# Patient Record
Sex: Female | Born: 1950 | Race: White | Hispanic: No
Health system: Midwestern US, Academic
[De-identification: ages and names within clinical notes are randomized; demographics above are authoritative.]

---

## 2012-07-17 ENCOUNTER — Ambulatory Visit: Admit: 2012-07-17 | Discharge: 2012-07-17 | Payer: MEDICARE | Attending: Sports Medicine

## 2012-07-17 ENCOUNTER — Inpatient Hospital Stay: Admit: 2012-07-17 | Payer: MEDICARE | Attending: Sports Medicine

## 2012-07-17 DIAGNOSIS — R52 Pain, unspecified: Secondary | ICD-10-CM

## 2012-07-17 DIAGNOSIS — M7989 Other specified soft tissue disorders: Secondary | ICD-10-CM

## 2012-07-17 NOTE — Unmapped (Signed)
This office note has been dictated.

## 2012-07-18 NOTE — Unmapped (Signed)
[facility]  AND SPORTS MEDICINE     PATIENT NAME:  Cindy Ramirez, Cindy Ramirez              MRN:  [id number]  DATE OF BIRTH:  November 23, 1950                   CSN:  [id number]  DICTATED BY:   Lysbeth Galas, M.D.        VISIT DATE:  07/17/2012                                       OFFICE NOTE     Cindy Ramirez is a 62 year old female who presents with left ankle pain. She was in  the airport in [place].  When she was walking off the plane stepped in  a ____ and actually rolled her ankle.  Had x-rays taken there that were  inconclusive.  Presents today for further evaluation.     The patient's past medical history is documented in the chart as part of the  electronic medical record and reviewed with the patient.     On physical exam she has diffuse swelling over the anterolateral aspect of  her ankle with tenderness and ecchymosis.  She has tenderness over the entire  talofibular, calcaneofibular ligaments as well as the distal fibula.  Moderate medial tenderness.  Dorsiflexion to neutral, plantarflexion to 20  degrees.  Has pain with inversion stress testing.  Neurologically is intact  and has good distal pulses and good capillary refill.     X-rays show no obvious fracture.  There is a small dystrophic calcification  area that actually may be in the soft tissue or actually in a vessel but  definitely not coming from the bone, it is well corticated.  Has some mild  periosteal elevation.     IMPRESSION: Left ankle sprain.     At this time we are going to place her in a high-tide boot.  She will return  in 1 week at which time we will repeat her x-ray.                                             Lysbeth Galas, M.D.  SWD/jwa  D:  07/18/2012 11:12  T:  07/23/2012 08:18  Job #:  [id number]                                                      OFFICE NOTE                                                               PAGE    1 of   1

## 2012-07-25 ENCOUNTER — Inpatient Hospital Stay: Admit: 2012-07-25 | Payer: MEDICARE | Attending: Sports Medicine

## 2012-07-25 ENCOUNTER — Ambulatory Visit: Admit: 2012-07-25 | Discharge: 2012-07-25 | Payer: MEDICARE | Attending: Sports Medicine

## 2012-07-25 DIAGNOSIS — M25569 Pain in unspecified knee: Secondary | ICD-10-CM

## 2012-07-25 DIAGNOSIS — M25579 Pain in unspecified ankle and joints of unspecified foot: Secondary | ICD-10-CM

## 2012-07-25 NOTE — Unmapped (Signed)
This office note has been dictated.

## 2012-07-25 NOTE — Unmapped (Signed)
[facility] AND SPORTS MEDICINE     PATIENT NAME:  Cindy Ramirez, Cindy Ramirez              MRN:  [id number]  DATE OF BIRTH:  Jun 22, 1950                   CSN:  [id number]  DICTATED BY:  Lysbeth Galas, M.D.         VISIT DATE:  07/25/2012                                       OFFICE NOTE     [name] returns for evaluation.  Her left ankle is actually doing better.  She had had the fall when she was walking through the airport in [place] and has been still with some pain.  There was a questionable  fracture and we put her in a boot.  Did repeat her x-rays.  X-rays show no  obvious fracture noted.  I think this is more of a sprain.     On physical exam on her left ankle she is more tender over the anterior  tibofibular and calcaneal fibular ligaments as well as in the deltoid  ligament area.  This is more of an ankle sprain.  We are going to treat this.     At the time of the fall and the fact that she is having some increasing  irritation and pain because she has been walking ____ she is developing more  knee pain than right ankle pain.  Her right ankle she had a significant  fracture in that ankle some time ago and has had significant pain with  increasing irritation and weakness through the peroneal.  The knee has pain  since her fall.     On physical exam on her ankle, the ankle has significant crepitus with  decreased range of motion.  She has a ganglion on the anterior capsular area.  She has weakness testing with the peroneal musculature with 4/5 strength with  increasing pain.  Her right knee has a large suprapatellar bursitis, tender  over the medial joint line, tender over the anterior tibial tuberosity.  Her  Lachman's is negative, however, does have a positive drop back on testing her  PCL.  She has pain with McMurray's testing.     At this time, her left ankle, we are going to treat her left ankle more for a  sprain.  We are going to  obtain an MRI on her right knee to rule out medial  meniscus tear.  MRI on her right ankle to rule out peroneal tendon tear.  She  will return after MRIs for further evaluation.                                                Lysbeth Galas, M.D.  SWD/smj  D:  07/25/2012 16:30  T:  07/30/2012 07:48  Job #:  [id number]           OFFICE NOTE  PAGE    1 of   1

## 2012-07-31 ENCOUNTER — Ambulatory Visit: Admit: 2012-07-31 | Discharge: 2012-07-31 | Payer: MEDICARE | Attending: Sports Medicine

## 2012-07-31 DIAGNOSIS — M2240 Chondromalacia patellae, unspecified knee: Secondary | ICD-10-CM

## 2012-07-31 NOTE — Unmapped (Signed)
This office note has been dictated.

## 2012-07-31 NOTE — Unmapped (Signed)
sched appt with dr Sandre Kitty on 08/25/12 at 12pm at [facility]

## 2012-08-01 NOTE — Unmapped (Signed)
[facility]  AND SPORTS MEDICINE     PATIENT NAME:  Cindy, Ramirez              MRN:  [id number]  DATE OF BIRTH:  Apr 15, 1950                   CSN:  [id number]  DICTATED BY:   Lysbeth Galas, M.D.        VISIT DATE:  07/31/2012                                       OFFICE NOTE     [name] returns for evaluation.  Her left ankle which was the ankle sprain  that she injured acutely, is doing better.  She still has a little tenderness  over the anterior talofibular and calcaneofibular ligaments.  However, she  did aggravate her right ankle and her right knee in the fall.  We had  obtained an MRI on her right knee.  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  A patellar tilt, elongated appearance of the lateral patellar  facet and a shallow trochlear groove.  No meniscal ligamentous tear, some  chronic osseous body posterior recess in the medial compartment, multiple  synovial cysts associated within the posterior aspect of the knee.  There was  also an area on the anterior aspect of the knee that thought may have been a  prepatellar bursitis; although, it does not look like there is any swelling  or cystic lesion through this area.  This is more soft tissue and fibrous  tissue.     Number two is her right ankle MRI.  The right ankle MRI was actually  impressive.  She has a chronic anterior talofibular ligament tear with  significant subluxation of the talus related to the tibia and on the anterior  fascia, she has talar calcaneal impingement.  Subluxation of the navicular  related to the talar head, stress response in the anterior process of the  calcaneus, low grade subarticular stress reaction in the cuneiform.  There is  no tendon or significant tearing.  Advanced chondromalacia and degenerative  arthritis and subarticular stress response in the talar  navicular joint.  Her  ankle, she has a lot of chronic changes which was aggravated by the acute  fall; however, we are going to go ahead and have her follow up with Dr. Zenaida Niece  or Dr. Sandre Kitty to evaluate her ankle.  These are chronic changes that need to  be addressed and she is progressively getting worse.     Number ___, her knee, she has patellofemoral dysfunction chondromalacia  patella.  We are going to get her started in a physical therapy program and  hopefully that will get that calmed down.  She will return in four weeks for  further evaluation.                                             Lysbeth Galas, M.D.  SWD/tlm  D:  08/01/2012 08:01  T:  08/05/2012 15:01  Job #:  [id number]                                                      OFFICE NOTE                                                               PAGE    1 of   1

## 2012-08-05 NOTE — Unmapped (Signed)
Faxed rx for aircast to [phone]

## 2012-08-05 NOTE — Unmapped (Signed)
Message copied by Renae Fickle on Tue Aug 05, 2012  4:33 PM  ------       Message from: Ester Rink       Created: Tue Aug 05, 2012 11:54 AM         Loel from [facility] called requesting office notes for [facility] and and mri report-fax to [phone].  He also wanted to know if he could place her left ankle in a brace or stirrup.  If so, fax a prescription to same #.  If you have any questions you can call him at [phone].  ------

## 2016-02-01 NOTE — Telephone Encounter (Signed)
PT is scheduled for a new pt appt on 12/7, pt states she has shingles and was wondering if she can be seen any earlier than 12/7

## 2016-02-01 NOTE — Telephone Encounter (Signed)
LVM informing patient I moved her apt for 11/17 at 11:45 arrival time 11:30

## 2016-02-01 NOTE — Telephone Encounter (Signed)
If I have a same day friday can add her on there

## 2016-02-03 ENCOUNTER — Ambulatory Visit: Admit: 2016-02-03 | Payer: MEDICARE

## 2016-02-03 DIAGNOSIS — Z4822 Encounter for aftercare following kidney transplant: Secondary | ICD-10-CM

## 2016-02-03 DIAGNOSIS — Z940 Kidney transplant status: Secondary | ICD-10-CM

## 2016-02-03 LAB — CBC
Hematocrit: 40.5 % (ref 35.0–45.0)
Hemoglobin: 13.7 g/dL (ref 11.7–15.5)
MCH: 31.1 pg (ref 27.0–33.0)
MCHC: 33.9 g/dL (ref 32.0–36.0)
MCV: 91.6 fL (ref 80.0–100.0)
MPV: 8 fL (ref 7.5–11.5)
Platelets: 286 10*3/uL (ref 140–400)
RBC: 4.42 10*6/uL (ref 3.80–5.10)
RDW: 13.6 % (ref 11.0–15.0)
WBC: 5.7 10*3/uL (ref 3.8–10.8)

## 2016-02-03 LAB — URINALYSIS W/RFL MICROSCOP, RFL CULTURE
Bilirubin, UA: NEGATIVE
Blood, UA: NEGATIVE
Glucose, UA: NEGATIVE mg/dL
Ketones, UA: NEGATIVE mg/dL
Leukocytes, UA: NEGATIVE
Nitrite, UA: NEGATIVE
Protein, UA: NEGATIVE mg/dL
RBC, UA: <1 /HPF (ref 0–3)
Specific Gravity, UA: 1.008 (ref 1.005–1.035)
Urobilinogen, UA: <2 mg/dL (ref 0.2–1.9)
WBC, UA: <1 /HPF (ref 0–5)
pH, UA: 6 (ref 5.0–8.0)

## 2016-02-03 LAB — DIFFERENTIAL
Basophils Absolute: 29 /uL (ref 0–200)
Basophils Relative: 0.5 % (ref 0.0–1.0)
Eosinophils Absolute: 74 /uL (ref 15–500)
Eosinophils Relative: 1.3 % (ref 0.0–8.0)
Lymphocytes Absolute: 2018 /uL (ref 850–3900)
Lymphocytes Relative: 35.4 % (ref 15.0–45.0)
Monocytes Absolute: 359 /uL (ref 200–950)
Monocytes Relative: 6.3 % (ref 0.0–12.0)
Neutrophils Absolute: 3221 /uL (ref 1500–7800)
Neutrophils Relative: 56.5 % (ref 40.0–80.0)
nRBC: 0 /100 WBC (ref 0–0)

## 2016-02-03 LAB — LIPID PANEL
Cholesterol, Total: 235 mg/dL (ref 0–200)
HDL: 58 mg/dL (ref 60–92)
LDL Cholesterol: 147 mg/dL
Triglycerides: 150 mg/dL (ref 10–149)

## 2016-02-03 LAB — VITAMIN D 25 HYDROXY: Vit D, 25-Hydroxy: 34.2 ng/mL (ref 30.0–100)

## 2016-02-03 LAB — RENAL FUNCTION PANEL, FASTING
Albumin: 3.9 g/dL (ref 3.5–5.7)
Anion Gap: 5 mmol/L (ref 3–16)
BUN: 26 mg/dL (ref 7–25)
CO2: 29 mmol/L (ref 21–33)
Calcium: 9.5 mg/dL (ref 8.6–10.3)
Chloride: 104 mmol/L (ref 98–110)
Creatinine: 0.98 mg/dL (ref 0.60–1.30)
Glucose: 104 mg/dL (ref 70–100)
Osmolality, Calculated: 291 mOsm/kg (ref 278–305)
Phosphorus: 3.9 mg/dL (ref 2.1–4.7)
Potassium: 4.4 mmol/L (ref 3.5–5.3)
Sodium: 138 mmol/L (ref 133–146)
eGFR AA CKD-EPI: 71 See note.
eGFR NONAA CKD-EPI: 61 See note.

## 2016-02-03 LAB — HEPATIC FUNCTION PANEL, SERUM
ALT: 12 U/L (ref 7–52)
AST (SGOT): 20 U/L (ref 13–39)
Albumin: 3.8 g/dL (ref 3.5–5.7)
Alkaline Phosphatase: 79 U/L (ref 36–125)
Bilirubin, Direct: 0.17 mg/dL (ref 0.00–0.40)
Bilirubin, Indirect: 0.53 mg/dL (ref 0.00–1.10)
Total Bilirubin: 0.7 mg/dL (ref 0.0–1.5)
Total Protein: 6.7 g/dL (ref 6.4–8.9)

## 2016-02-03 LAB — HEMOGLOBIN A1C: Hemoglobin A1C: 4.7 % (ref 4.8–6.4)

## 2016-02-03 LAB — THYROID FUNCTION CASCADE: TSH: 1.37 u[IU]/mL (ref 0.34–5.60)

## 2016-02-03 MED ORDER — acetaminophen-codeine (TYLENOL #3) 300-30 mg per tablet
300-30 | ORAL_TABLET | Freq: Four times a day (QID) | ORAL | 0 refills | 8.00000 days | Status: AC | PRN
Start: 2016-02-03 — End: 2016-10-16

## 2016-02-03 NOTE — Unmapped (Signed)
[facility]  [facility] HEALTH PRIMARY CARE MED PEDS  [address]  [place] [address]    Name:  Cindy Ramirez Date of Birth: February 28, 1951 (65 y.o.)   MRN: [id number]    Date of Service:  02/03/2016     Subjective:     Chief Complaint   Patient presents with   ??? New Patient Visit/ Consultation     Establishment of care   ??? Herpes Zoster     x 9 days     History of Present Illness:  Cindy Ramirez is a(n) 65 y.o. 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 citalopram (CELEXA) 40 MG tablet Take 40 mg by mouth daily.     ??? cycloSPORINE modified, NEORAL, 50 MG capsule Take 75 mg by mouth 2 times a day.     ??? levothyroxine (SYNTHROID, LEVOTHROID) 25 MCG tablet Take 25 mcg by mouth daily.     ??? multivitamin (THERAGRAN) tablet Take 1 tablet by mouth daily.     ??? nitrofurantoin (MACRODANTIN) 100 MG capsule Take 100 mg by mouth 4 times a day with meals and at bedtime.     ??? vitamin B complex w-C (B-COMPLEX WITH VITAMIN C) 15-100-4-500 mg Tab Take 1 tablet by mouth daily.     ??? acyclovir (ZOVIRAX) 200 MG capsule Take by mouth every 4 (four) hours while awake.     ??? cholecalciferol, vitamin D3, 1000 units tablet Take 1,000 Units by mouth daily.     ??? gabapentin (NEURONTIN) 100 MG capsule Take 100 mg by mouth 3 times a day.     ??? mycophenolate (CELLCEPT) 500 mg tablet Take 1,000 mg by mouth 2 times a day.     ??? NIFEdipine (ADALAT CC) 30 MG 24 hr tablet Take 30 mg by mouth daily.     ??? pravastatin (PRAVACHOL) 20 MG tablet Take 20 mg by mouth daily.     ??? vitamin E 1000 UNIT capsule Take 1,000 Units by mouth daily.       No current facility-administered medications for this visit.        Review of Systems   Constitutional: Positive for fatigue. Negative for chills and fever.   HENT: Negative for congestion and rhinorrhea.    Respiratory: Negative for cough and shortness of breath.    Cardiovascular: Negative for chest pain.   Gastrointestinal: Positive for abdominal distention (just an ache over her transplant).   Genitourinary: Negative for dysuria.   Skin: Positive for rash (shingles).   All other systems reviewed and are negative.           Objective:     Vitals:    02/03/16 1153   BP: 130/82   BP Location: Right arm   Patient Position: Sitting   BP Cuff Size: Large   Pulse: 68   SpO2: 97%   Weight: (!) 226 lb (102.5 kg)   Height: 5' 9 (1.753 m)     Body mass index is 33.37 kg/m??.    Physical Exam   Constitutional: She appears  well-developed and well-nourished. No distress.   Slightly pale   HENT:   Head: Normocephalic and atraumatic.   Right Ear: External ear normal.   Left Ear: External ear normal.   Mouth/Throat: Oropharynx is clear and moist. No oropharyngeal exudate.   Eyes: Conjunctivae are normal. Right eye exhibits no discharge. Left eye exhibits no discharge. No scleral icterus.   Neck: Neck supple.   Cardiovascular: Normal rate, regular rhythm, normal heart sounds and intact distal pulses.  Exam reveals no gallop and no friction rub.    No murmur heard.  Pulmonary/Chest: Effort normal. No respiratory distress. She has no wheezes.   Abdominal: Soft. She exhibits no distension. There is no tenderness.   Musculoskeletal: She exhibits no edema.   Lymphadenopathy:     She has no cervical adenopathy.   Neurological: She is alert. Coordination normal.   Skin: Skin is warm and dry. Rash (zoster rash right lower back) noted.            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

## 2016-02-03 NOTE — Telephone Encounter (Signed)
Called, but the office is closed. I will call Monday to find out fax number and fax labs at that time.

## 2016-02-03 NOTE — Unmapped (Signed)
Get blood work today  Let me know if anything changes

## 2016-02-03 NOTE — Unmapped (Signed)
Called to discuss blood work results  kidney function ok, high cholesterol but otherwise normal    She would like a copy of these sent to her transplant doctor in [place], Dr. Ewell Poe whose phone # is Phone  [phone]  Can we please get him a copy, thanks!

## 2016-02-05 DIAGNOSIS — Z940 Kidney transplant status: Secondary | ICD-10-CM

## 2016-02-06 NOTE — Telephone Encounter (Signed)
Faxed to [phone]

## 2016-02-14 NOTE — Unmapped (Signed)
She is here for evaluation of her neck.  She is concerned about a lymph node in  her left neck.  She also had a cyst seen on dental x-rays.  Her ears are normal.  The nose is allergic.  The pharynx, mouth, and neck are negative.  I can feel a  small jugulodigastric node that I think is normal.  I have recommended  observation and follow-up in three months.  I have asked her to review this with  her renal doctor in case they want to be more aggressive in considering biopsy.  She will have the dental films forwarded to me so I can view these, but I think  these are most likely just an incidental finding.    Dictated but not read.

## 2016-02-14 NOTE — Unmapped (Signed)
Pt here for enlarged cervical lymph nodes and a nasal polyp found by her dentist

## 2016-02-23 ENCOUNTER — Encounter

## 2016-06-25 MED ORDER — polymyxin B sulf-trimethoprim (POLYTRIM) 10,000 unit- 1 mg/mL Drop ophthalmic solution
10000 | Freq: Four times a day (QID) | OPHTHALMIC | 0 refills | 15.00000 days | Status: AC
Start: 2016-06-25 — End: 2016-10-24

## 2016-06-25 NOTE — Telephone Encounter (Signed)
Family notified

## 2016-06-25 NOTE — Telephone Encounter (Signed)
Pt is calling wanting something to be prescribed to treat sx that started 4 days ago, they include eye pain/soreness, the eye is red/pink, itchy, irritated and has some discharge.

## 2016-06-25 NOTE — Telephone Encounter (Signed)
Will send in eye drops but if no improvement in the next 3-5 days or severe pain should be seen

## 2016-10-08 ENCOUNTER — Ambulatory Visit: Payer: MEDICARE

## 2016-10-16 ENCOUNTER — Ambulatory Visit: Admit: 2016-10-16 | Discharge: 2016-10-16 | Payer: MEDICARE | Attending: Sports Medicine

## 2016-10-16 DIAGNOSIS — M25511 Pain in right shoulder: Secondary | ICD-10-CM

## 2016-10-16 MED ORDER — betamethasone acetate-betamethasone sodium phosphate (CELESTONE) injection 12 mg
6 | INTRAMUSCULAR | Status: AC
Start: 2016-10-16 — End: 2016-10-16
  Administered 2016-10-16: 18:00:00 12 mg via INTRAMUSCULAR

## 2016-10-16 MED ORDER — bupivacaine (MARCAINE) 0.5 % (5 mg/mL) injection 20 mg
0.5 | Freq: Once | INTRAMUSCULAR | Status: AC
Start: 2016-10-16 — End: 2016-10-16
  Administered 2016-10-16: 18:00:00 20 mL via SUBCUTANEOUS

## 2016-10-16 MED ORDER — lidocaine 10 mg/mL (1 %) injection 4 mL
10 | Freq: Once | INTRAMUSCULAR | Status: AC
Start: 2016-10-16 — End: 2016-10-16
  Administered 2016-10-16: 18:00:00 4 mL via SUBCUTANEOUS

## 2016-10-16 NOTE — Unmapped (Signed)
Per provider's instructions, the patient was prepped for an injection - as well as drug and  allergies were reviewed.   The appropriate medication was drawn, and documented.  A medical assistant was also present to assist with the injection.   The patient was then educated on what to expect following the injection.   All questions were answered.    A verbal consent was obtained and a verbal timeout was performed on 10/16/2016 at 1:40 PM.     ??  Pamella Pert, MA

## 2016-10-16 NOTE — Unmapped (Signed)
[facility] AND SPORTS MEDICINE     PATIENT NAME:  Cindy Ramirez, Cindy Ramirez                  MRN:  [id number]  DATE OF BIRTH:  1950-04-29                       CSN:  [id number]  PROVIDER:  Lysbeth Galas, M.D.                VISIT DATE:  10/16/2016                                       OFFICE NOTE     CHIEF COMPLAINT:  Left arm pain.     Glora has had left arm pain that has been going on for approximately 2 to 3  months, worsening pain with pain radiating into the deltoid area.  She  presents today for further evaluation.  Denies any injury.     Past medical history is documented in the chart as part of the electronic  medical record and reviewed with the patient and is actually remarkable for a  renal transplant 18 years ago.     On physical exam, she has decreased range of motion of her shoulder,  secondary to pain, with abduction to 90 degrees, forward flexion to 90  degrees and decreased internal rotation.  She has pain with testing the  rotator cuff with 4+/5 strength.  Positive impingement sign.     IMPRESSION:  Left shoulder subacromial impingement, rotator cuff syndrome.     I think most of her pain is coming from her shoulder.  She does have referred  pain to the deltoid a little bit distal to that.  Today, under sterile  conditions, we injected her shoulder with 2 mL of Celestone, 4 of Marcaine  and 4 of Xylocaine.  I would like to see how she responds with this and see  if this is the etiology of her pain.  She will return in 4 days for further  evaluation.                                              Lysbeth Galas, M.D.  SWD/cr  D:  10/16/2016 13:37  T:  10/17/2016 15:22  Job #:  [id number]           OFFICE NOTE                                                  PAGE    1 of   1

## 2016-10-19 ENCOUNTER — Ambulatory Visit: Admit: 2016-10-19 | Discharge: 2016-10-19 | Payer: MEDICARE | Attending: Sports Medicine

## 2016-10-19 DIAGNOSIS — M25511 Pain in right shoulder: Secondary | ICD-10-CM

## 2016-10-19 NOTE — Unmapped (Signed)
This office note has been dictated.

## 2016-10-19 NOTE — Unmapped (Signed)
[facility] AND SPORTS MEDICINE     PATIENT NAME:  Cindy Ramirez, Cindy Ramirez                  MRN:  [id number]  DATE OF BIRTH:  [birth year]                       CSN:  [id number]  PROVIDER:  Lysbeth Galas, M.D.                VISIT DATE:  10/19/2016                                       OFFICE NOTE     CHIEF COMPLAINT: Left shoulder pain.     Baelynn returns.  Actually she did get good relief with the injection.  States she is about 75 to 80% better.  Still has pain with overhead activity  but she is sleeping at night.  We are going to start her in physical therapy  for her rotator cuff syndrome, subacromial impingement.  She will return in 3  weeks for further evaluation.                                              Lysbeth Galas, M.D.  SWD/ja  D:  10/19/2016 12:11  T:  10/22/2016 09:41  Job #:  [id number]           OFFICE NOTE                                                  PAGE    1 of   1

## 2016-10-23 ENCOUNTER — Encounter: Attending: Sports Medicine

## 2016-10-24 ENCOUNTER — Ambulatory Visit: Admit: 2016-10-24 | Payer: MEDICARE

## 2016-10-24 ENCOUNTER — Ambulatory Visit: Admit: 2016-10-24 | Discharge: 2016-11-02 | Payer: MEDICARE

## 2016-10-24 DIAGNOSIS — R300 Dysuria: Secondary | ICD-10-CM

## 2016-10-24 LAB — POCT URINALYSIS DIPSTICK, NONAUTOMATED; W/MICRO
POCT - Bilirubin, UA: NEGATIVE
POCT - Glucose, UA: NEGATIVE
POCT - Ketones, UA: NEGATIVE
POCT - Nitrite, UA: NEGATIVE
POCT - Specific Gravity, Urine: 1.01
POCT - Urobilinogen, UA: 0.2
POCT - pH, UA: 6

## 2016-10-24 LAB — URINE CULTURE, OUTPATIENT

## 2016-10-24 LAB — CBC
Hematocrit: 38.6 % (ref 35.0–45.0)
Hemoglobin: 13.1 g/dL (ref 11.7–15.5)
MCH: 32.2 pg (ref 27.0–33.0)
MCHC: 33.8 g/dL (ref 32.0–36.0)
MCV: 95.2 fL (ref 80.0–100.0)
MPV: 7.9 fL (ref 7.5–11.5)
Platelets: 293 10E3/uL (ref 140–400)
RBC: 4.06 10E6/uL (ref 3.80–5.10)
RDW: 13.5 % (ref 11.0–15.0)
WBC: 12.3 10E3/uL — ABNORMAL HIGH (ref 3.8–10.8)

## 2016-10-24 LAB — RENAL FUNCTION PANEL W/EGFR
Albumin: 3.7 g/dL (ref 3.5–5.7)
Anion Gap: 9 mmol/L (ref 3–16)
BUN: 37 mg/dL (ref 7–25)
CO2: 23 mmol/L (ref 21–33)
Calcium: 8.6 mg/dL (ref 8.6–10.3)
Chloride: 105 mmol/L (ref 98–110)
Creatinine: 1.47 mg/dL (ref 0.60–1.30)
Glucose: 106 mg/dL (ref 70–100)
Osmolality, Calculated: 293 mOsm/kg (ref 278–305)
Phosphorus: 3.3 mg/dL (ref 2.1–4.7)
Potassium: 4.2 mmol/L (ref 3.5–5.3)
Sodium: 137 mmol/L (ref 133–146)
eGFR AA CKD-EPI: 43 See note.
eGFR NONAA CKD-EPI: 37 See note.

## 2016-10-24 LAB — DIFFERENTIAL
Basophils Absolute: 74 /uL (ref 0–200)
Basophils Relative: 0.6 % (ref 0.0–1.0)
Eosinophils Absolute: 62 /uL (ref 15–500)
Eosinophils Relative: 0.5 % (ref 0.0–8.0)
Lymphocytes Absolute: 1230 /uL (ref 850–3900)
Lymphocytes Relative: 10 % (ref 15.0–45.0)
Monocytes Absolute: 1058 /uL (ref 200–950)
Monocytes Relative: 8.6 % (ref 0.0–12.0)
Neutrophils Absolute: 9877 /uL (ref 1500–7800)
Neutrophils Relative: 80.3 % (ref 40.0–80.0)
nRBC: 0 /100 WBC (ref 0–0)

## 2016-10-24 MED ORDER — ciprofloxacin HCl (CIPRO) 500 MG tablet
500 | ORAL_TABLET | Freq: Two times a day (BID) | ORAL | 0 refills | Status: AC
Start: 2016-10-24 — End: 2016-10-26

## 2016-10-24 MED ORDER — nitrofurantoin (MACRODANTIN) 100 MG capsule
100 | ORAL_CAPSULE | Freq: Every evening | ORAL | 1 refills | Status: AC
Start: 2016-10-24 — End: 2016-10-26

## 2016-10-24 NOTE — Progress Notes (Signed)
[facility]  [facility] HEALTH PRIMARY CARE MED PEDS  [address]  [place] [address]    Name:  Cindy Ramirez Date of Birth: 1950-07-04 (66 y.o.)   MRN: [id number]    Date of Service:  10/24/2016     Subjective:     Chief Complaint   Patient presents with    Urinary Frequency     x 1 day     Fever     History of Present Illness:  Cindy Ramirez is a(n) 66 y.o. female here today for the following:   HPI     Per last Tuesday had a cortizone shot in her right shoulder 1 week ago  Per pt noticed that she is having a hard time holding her urine - started to get bad last night, then started feeling systemically ill  Fever today was up to 100  Had emesis 2 days ago  Has been nauseated  Feels achy all over  Per pt urine is cloudy      Current Outpatient Prescriptions   Medication Sig Dispense Refill    amLODIPine (NORVASC) 5 MG tablet Take 5 mg by mouth daily.      citalopram (CELEXA) 40 MG tablet Take 40 mg by mouth daily.      cycloSPORINE modified, NEORAL, 50 MG capsule Take 75 mg by mouth 2 times a day.      esomeprazole (NEXIUM) 40 MG capsule Take 40 mg by mouth every morning before breakfast.      levothyroxine (SYNTHROID, LEVOTHROID) 50 MCG tablet Take 50 mcg by mouth every morning before breakfast.      metoprolol tartrate (LOPRESSOR) 50 MG tablet Take 50 mg by mouth 2 times a day.      multivitamin (THERAGRAN) tablet Take 1 tablet by mouth daily.      vitamin B complex w-C (B-COMPLEX WITH VITAMIN C) 15-100-4-500 mg Tab Take 1 tablet by mouth daily.      nitrofurantoin (MACRODANTIN) 100 MG capsule Take 100 mg by mouth 4 times a day with meals and at bedtime.      polymyxin B sulf-trimethoprim (POLYTRIM) 10,000 unit- 1 mg/mL Drop ophthalmic solution Place 1 drop into both eyes every 6 hours. 10 mL 0     No current facility-administered medications for this visit.       Review of Systems   Constitutional: Positive for fatigue and fever.   HENT: Negative for congestion and rhinorrhea.     Respiratory: Negative for cough and shortness of breath.    Cardiovascular: Negative for chest pain.   Gastrointestinal: Negative for abdominal pain.   Musculoskeletal: Positive for myalgias.            Objective:     Vitals:    10/24/16 1524   BP: 122/76   BP Location: Left arm   Patient Position: Sitting   BP Cuff Size: Large   Pulse: 74   Temp: 99.9 F (37.7 C)   SpO2: 98%   Weight: (!) 242 lb 12.8 oz (110.1 kg)     Body mass index is 35.86 kg/m.    Physical Exam   Constitutional: She appears well-developed and well-nourished. No distress.   Slightly pale   HENT:   Head: Normocephalic and atraumatic.   Right Ear: External ear normal.   Left Ear: External ear normal.   Mouth/Throat: Oropharynx is clear and moist. No oropharyngeal exudate.   Eyes: Conjunctivae are normal. Right eye exhibits no discharge. Left eye  exhibits no discharge. No scleral icterus.   Neck: Neck supple.   Cardiovascular: Normal rate, regular rhythm, normal heart sounds and intact distal pulses.  Exam reveals no gallop and no friction rub.    No murmur heard.  Pulmonary/Chest: Effort normal. No respiratory distress. She has no wheezes.   Abdominal: Soft. She exhibits no distension and no mass. There is no tenderness. There is no rebound and no guarding.   Musculoskeletal: She exhibits no edema.   Lymphadenopathy:     She has no cervical adenopathy.   Neurological: She is alert. Coordination normal.   Skin: Skin is warm and dry. No rash (zoster rash right lower back) noted.            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

## 2016-10-24 NOTE — Unmapped (Signed)
Pt is calling requesting to be seen asap for sx that started today, they include; fever, increased frequency with urination, pt denies any back pain, abdominal pain, pain with urination, or blood in urine.

## 2016-10-24 NOTE — Telephone Encounter (Signed)
OB today

## 2016-10-24 NOTE — Unmapped (Signed)
Scheduled

## 2016-10-25 ENCOUNTER — Emergency Department: Admit: 2016-10-26 | Payer: MEDICARE

## 2016-10-25 DIAGNOSIS — A419 Sepsis, unspecified organism: Principal | ICD-10-CM

## 2016-10-25 MED ORDER — sodium chloride 0.9 % 1,000 mL IV fluid
Freq: Once | INTRAVENOUS | Status: AC
Start: 2016-10-25 — End: 2016-10-26
  Administered 2016-10-26: 04:00:00 1000 mL via INTRAVENOUS

## 2016-10-25 MED ORDER — cefTRIAXone (ROCEPHIN) 2 g in sodium chloride 0.9% 100 mL ADDaptor IVPB
2 | Freq: Once | INTRAMUSCULAR | Status: AC
Start: 2016-10-25 — End: 2016-10-26
  Administered 2016-10-26: 04:00:00 2 g via INTRAVENOUS

## 2016-10-25 MED FILL — SODIUM CHLORIDE 0.9 % INTRAVENOUS SOLUTION: 1000.00 1000.00 mL | INTRAVENOUS | Qty: 1000

## 2016-10-25 MED FILL — CEFTRIAXONE 2 GRAM SOLUTION FOR INJECTION: 2 2 gram | INTRAMUSCULAR | Qty: 1

## 2016-10-25 MED FILL — SODIUM CHLORIDE 0.9 % INTRAVENOUS PIGGYBACK: 2.00 2.00 g | INTRAVENOUS | Qty: 100

## 2016-10-25 NOTE — Unmapped (Signed)
Pt brought to ED via spouse stating she is a kidney transplant pt and has a fever of 104. States symptoms started a couple days ago.

## 2016-10-25 NOTE — ED Provider Notes (Signed)
[facility] EMERGENCY DEPARTMENT   EMERGENCY DEPARTMENT ENCOUNTER:    Critical Care Time:  Due to the immediate potential for life-threatening deterioration due to sepsis, complicated UTI, I spent 38 minutes providing critical care.  This time is excluding time spent performing procedures.       Chief Complaint:  Fever     Nursing notes reviewed, old charts were also reviewed.     History of Present Illness:  Ms. Cindy Ramirez is a 66 y.o. Caucasian female with a history of kidney transplant 20+ years ago who presents with a fever for several days.  Was seen by PCP yesterday and had urine culture done that has since grown E coli, sensitivities are still pending.  Patient states that she is having pain over her transplanted kidney.  She continues to run fevers as high as 104.0.  She denies nausea, vomiting, diarrhea, cough, cold symptoms, pain with urination or blood in her urine.    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     Social History Main Topics    Smoking status: Never Smoker    Smokeless tobacco: Never Used    Alcohol use No    Drug use: Unknown    Sexual activity: Not on file     Other Topics Concern    Not on file     Social History Narrative    Pt is  married to susan         Current Outpatient Medications:   Patient's Medications   New Prescriptions    No medications on file   Previous Medications    ACETAMINOPHEN (TYLENOL) 500 MG TABLET    Take 500 mg by mouth every 6 hours as needed.    AMLODIPINE (NORVASC) 5 MG TABLET    Take 5 mg by mouth daily.    CIPROFLOXACIN HCL (CIPRO) 500 MG TABLET    Take 1 tablet (500 mg total) by mouth 2 times a day.    CITALOPRAM (CELEXA) 40 MG TABLET    Take 40 mg by mouth daily.    CYCLOSPORINE MODIFIED, NEORAL, 50 MG CAPSULE    Take 75 mg by mouth 2 times a day.    ESOMEPRAZOLE (NEXIUM) 40 MG CAPSULE    Take 40 mg by mouth every morning before breakfast.    LEVOTHYROXINE (SYNTHROID, LEVOTHROID) 50 MCG TABLET  Take 50 mcg by mouth every morning before breakfast.    METOPROLOL TARTRATE (LOPRESSOR) 50 MG TABLET    Take 50 mg by mouth 2 times a day.    MULTIVITAMIN (THERAGRAN) TABLET    Take 1 tablet by mouth daily.    NITROFURANTOIN (MACRODANTIN) 100 MG CAPSULE    Take 1 capsule (100 mg total) by mouth at bedtime.    VITAMIN B COMPLEX W-C (B-COMPLEX WITH VITAMIN C) 15-100-4-500 MG TAB    Take 1 tablet by mouth daily.   Modified Medications    No medications on file   Discontinued Medications    No medications on file        Allergies:    Allergies   Allergen Reactions    Ciprofloxacin     Floxin [Ofloxacin]     Mycophenolate Mofetil         Family History:    Family History   Problem Relation Age of Onset    Diabetes Mother     Heart disease Mother     Parkinsonism Father     Heart disease Father     Cancer Father      prostate cancer        Review of Systems:    Negative for nausea, vomiting, diarrhea, constipation, headache, neck pain, chest pain, shortness of breath, abdominal pain, dysuria, hematuria, blood in the urine or stool, rash.     Physical Exam:   ED Triage Vitals [10/25/16 2157]   Enc Vitals Group      BP 128/67      Heart Rate 92      Resp 18      Temp 98.5 F (36.9 C)      Temp Source Temporal      SpO2  96 %      Weight (!) 240 lb (108.9 kg)      Height 5' 9 (1.753 m)      Head Circumference       Peak Flow       Pain Score Zero      Pain Loc       Pain Edu?       Excl. in GC?       Vitals:    10/25/16 2157 10/25/16 2242 10/25/16 2326 10/26/16 0031   BP: 128/67 121/57 (!) 95/32 126/48   BP Location: Right arm Right arm Left arm Left arm   Patient Position: Sitting Sitting Lying Lying   Pulse: 92 94 81 83   Resp: 18 18 16 14    Temp: 98.5 F (36.9 C) 99.2 F (37.3 C) 101 F (38.3 C) 100.4 F (38 C)   TempSrc: Temporal Oral Oral Oral   SpO2: 96% 97% 97% 94%   Weight: (!) 240 lb (108.9 kg)      Height: 5' 9 (1.753 m)        Body mass index is 35.44 kg/m.   GENERAL:   Awake, alert and oriented x 4, in no apparent distress.  Normal respiratory effort.  Body mass index is 35.44 kg/m.   HEENT:  Atraumatic, normocephalic, PERRL, EOMI, oropharynx is clear with pink, moist mucous membranes, palatal arches are symmetric and uvula is midline  NECK:  Supple, no LAD, no thyromegaly, trachea is midline  LUNGS: CTAB without wheezes, rubs, rhonchi, rales or stridor, normal respiratory effort  CV: normal s1, s2, regular rate and rhythm without murmur, rub or gallop  ABDOMEN: soft, TTP over RLQ where her transplanted kidney is  located, nondistended, no masses, no hepatosplenomegaly, no rebound tenderness or guarding  EXTREMITIES: no edema or clubbing, 2+ radial and pedal pulses, no deformity  SKIN: warm, dry and intact, no bruising or lacerations  NEURO: Awake, alert and oriented x 4, moves all four extremities spontaneously and symmetrically, follows commands, speaks in full and fluent sentences    Laboratory Studies:    Labs Reviewed   BASIC METABOLIC PANEL - Abnormal; Notable for the following:        Result Value    CO2 20 (*)     BUN 34 (*)     Creatinine 1.77 (*)     Glucose 124 (*)     Calcium 8.3 (*)     All other components within normal limits   CBC - Abnormal; Notable for the following:     WBC 12.7 (*)     RBC 3.68  (*)     Hematocrit 33.8 (*)     All other components within normal limits   DIFFERENTIAL - Abnormal; Notable for the following:     Neutrophils Relative 81.7 (*)     Lymphocytes Relative 9.5 (*)     Neutrophils Absolute 10,376 (*)     Monocytes Absolute 1,080 (*)     All other components within normal limits   PHOSPHORUS - Abnormal; Notable for the following:     Phosphorus 2.0 (*)     All other components within normal limits   URINALYSIS W/RFL TO MICROSCOP, NO CULT - Abnormal; Notable for the following:     Protein, UA 30 (*)     Blood, UA Small (*)     Leukocytes, UA Large (*)     WBC, UA 95 (*)     All other components within normal limits   LACTIC ACID   MAGNESIUM         Radiology Studies:    X-ray Chest PA and Lateral   Final Result   IMPRESSION:      No acute cardiopulmonary disease.          Report Verified by: Lana Fish, M.D. at 10/25/2016 11:29 PM EDT            Other Studies (Including Relevent Prior Studies):  none    Procedures:  none    Emergency Department Course:  Cindy Ramirez was seen and evaluated.  She did drop her blood pressure to the 90's, this has responded to fluids.  She is given Rocephin.  She is admitted.        Medical Decision Making:  Ms. Cindy Ramirez is a 66 y.o. female with a history of kidney transplant, immunosuppressed, who presents with fever, urinary tract infection, probable pyelonephritis.  Patient has been increase in her creatinine, did transiently drop her blood pressure but is responded to IV fluids.  Given the concern for early sepsis, compensated urinary tract infection in an immunocompromised patient, worsening creatinine and a kidney transplant patient I feel patient requires admission and she will be admitted to a stepdown unit.  She does not appear toxic.    Impression:   1. Sepsis due to Escherichia coli (CMS Dx)    2. Complicated UTI (urinary tract infection)    3. AKI (acute kidney injury) (CMS Dx)         Plan:    1.  As above.     Disposition:  Admitted  to hospitalist in critical but stable condition.     Portions of this note were  generated using voice recognition software.  Every effort was made to ensure the accuracy of transcription, however some discrepancies may remain.       Dartha Lodge, MD  10/26/16 [phone]

## 2016-10-25 NOTE — Unmapped (Signed)
Called to check up on pt, resistent to going to ED, states feeling better, Temp 100.5 and drinking lots of water, repeat renal today/first thing tomorrow morning - ordered

## 2016-10-25 NOTE — Unmapped (Signed)
Addended by: Olivia Mackie E on: 10/25/2016 12:28 PM     Modules accepted: Orders

## 2016-10-25 NOTE — Telephone Encounter (Signed)
Pt with mild AKI and setting of renal transplant, aparently had high fevers last night, called pt's wife and pt and encouraged pt to go to ed for re-eval, re-check of renal function and possible IVF vs admission pending evaluation

## 2016-10-26 ENCOUNTER — Inpatient Hospital Stay
Admission: EM | Admit: 2016-10-26 | Discharge: 2016-10-28 | Disposition: A | Discharge status: Home / Self Care | Payer: MEDICARE

## 2016-10-26 LAB — CBC
Hematocrit: 33.8 % — ABNORMAL LOW (ref 35.0–45.0)
Hematocrit: 30.4 % (ref 35.0–45.0)
Hemoglobin: 11.7 g/dL (ref 11.7–15.5)
Hemoglobin: 10.4 g/dL (ref 11.7–15.5)
MCH: 31.9 pg (ref 27.0–33.0)
MCH: 31.7 pg (ref 27.0–33.0)
MCHC: 34.7 g/dL (ref 32.0–36.0)
MCHC: 34.2 g/dL (ref 32.0–36.0)
MCV: 91.9 fL (ref 80.0–100.0)
MCV: 92.6 fL (ref 80.0–100.0)
MPV: 7.9 fL (ref 7.5–11.5)
MPV: 7.8 fL (ref 7.5–11.5)
Platelets: 215 10E3/uL (ref 140–400)
Platelets: 187 10*3/uL (ref 140–400)
RBC: 3.68 10E6/uL — ABNORMAL LOW (ref 3.80–5.10)
RBC: 3.28 10*6/uL (ref 3.80–5.10)
RDW: 12.8 % (ref 11.0–15.0)
RDW: 13 % (ref 11.0–15.0)
WBC: 12.7 10E3/uL — ABNORMAL HIGH (ref 3.8–10.8)
WBC: 9.8 10*3/uL (ref 3.8–10.8)

## 2016-10-26 LAB — URINALYSIS W/RFL TO MICROSCOPIC
Bilirubin, UA: NEGATIVE
Glucose, UA: NEGATIVE mg/dL
Ketones, UA: NEGATIVE mg/dL
Nitrite, UA: NEGATIVE
Protein, UA: 30 mg/dL — AB
RBC, UA: 3 /HPF (ref 0–3)
Specific Gravity, UA: 1.012 (ref 1.005–1.035)
Squam Epithel, UA: <1 /HPF (ref 0–5)
Urobilinogen, UA: <2 mg/dL (ref 0.2–1.9)
WBC, UA: 95 /HPF — ABNORMAL HIGH (ref 0–5)
pH, UA: 6 (ref 5.0–8.0)

## 2016-10-26 LAB — LACTIC ACID: Lactate: 0.5 mmol/L (ref 0.5–2.2)

## 2016-10-26 LAB — BASIC METABOLIC PANEL
Anion Gap: 10 mmol/L (ref 3–16)
BUN: 34 mg/dL — ABNORMAL HIGH (ref 7–25)
CO2: 20 mmol/L — ABNORMAL LOW (ref 21–33)
Calcium: 8.3 mg/dL — ABNORMAL LOW (ref 8.6–10.3)
Chloride: 105 mmol/L (ref 98–110)
Creatinine: 1.77 mg/dL — ABNORMAL HIGH (ref 0.60–1.30)
Glucose: 124 mg/dL — ABNORMAL HIGH (ref 70–100)
Osmolality, Calculated: 289 mosm/kg (ref 278–305)
Potassium: 3.9 mmol/L (ref 3.5–5.3)
Sodium: 135 mmol/L (ref 133–146)
eGFR AA CKD-EPI: 35 See note.
eGFR NONAA CKD-EPI: 30 See note.

## 2016-10-26 LAB — URINE CULTURE, SPECIAL POPULATION
Culture Result: <1000
Culture Result: NORMAL

## 2016-10-26 LAB — RENAL FUNCTION PANEL W/EGFR
Albumin: 2.8 g/dL (ref 3.5–5.7)
Anion Gap: 7 mmol/L (ref 3–16)
BUN: 27 mg/dL (ref 7–25)
CO2: 23 mmol/L (ref 21–33)
Calcium: 8.2 mg/dL (ref 8.6–10.3)
Chloride: 110 mmol/L (ref 98–110)
Creatinine: 1.66 mg/dL (ref 0.60–1.30)
Glucose: 122 mg/dL (ref 70–100)
Osmolality, Calculated: 296 mOsm/kg (ref 278–305)
Phosphorus: 2.9 mg/dL (ref 2.1–4.7)
Potassium: 3.8 mmol/L (ref 3.5–5.3)
Sodium: 140 mmol/L (ref 133–146)
eGFR AA CKD-EPI: 37 See note.
eGFR NONAA CKD-EPI: 32 See note.

## 2016-10-26 LAB — DIFFERENTIAL
Basophils Absolute: 13 /uL (ref 0–200)
Basophils Absolute: 20 /uL (ref 0–200)
Basophils Relative: 0.1 % (ref 0.0–1.0)
Basophils Relative: 0.2 % (ref 0.0–1.0)
Eosinophils Absolute: 25 /uL (ref 15–500)
Eosinophils Absolute: 20 /uL (ref 15–500)
Eosinophils Relative: 0.2 % (ref 0.0–8.0)
Eosinophils Relative: 0.2 % (ref 0.0–8.0)
Lymphocytes Absolute: 1207 /uL (ref 850–3900)
Lymphocytes Absolute: 1225 /uL (ref 850–3900)
Lymphocytes Relative: 9.5 % — ABNORMAL LOW (ref 15.0–45.0)
Lymphocytes Relative: 12.5 % (ref 15.0–45.0)
Monocytes Absolute: 1080 /uL — ABNORMAL HIGH (ref 200–950)
Monocytes Absolute: 862 /uL (ref 200–950)
Monocytes Relative: 8.5 % (ref 0.0–12.0)
Monocytes Relative: 8.8 % (ref 0.0–12.0)
Neutrophils Absolute: 10376 /uL — ABNORMAL HIGH (ref 1500–7800)
Neutrophils Absolute: 7673 /uL (ref 1500–7800)
Neutrophils Relative: 81.7 % — ABNORMAL HIGH (ref 40.0–80.0)
Neutrophils Relative: 78.3 % (ref 40.0–80.0)

## 2016-10-26 LAB — MAGNESIUM
Magnesium: 2 mg/dL (ref 1.5–2.5)
Magnesium: 2 mg/dL (ref 1.5–2.5)

## 2016-10-26 LAB — BLOOD CULTURE-PERIPHERAL
Culture Result: NO GROWTH
Culture Result: NO GROWTH

## 2016-10-26 LAB — PHOSPHORUS: Phosphorus: 2 mg/dL (ref 2.1–4.7)

## 2016-10-26 MED ORDER — cycloSPORINE modified (NEORAL/GENGRAF) capsule 75 mg
25 | Freq: Two times a day (BID) | ORAL | Status: AC
Start: 2016-10-26 — End: 2016-10-28
  Administered 2016-10-26 – 2016-10-28 (×6): 75 mg via ORAL

## 2016-10-26 MED ORDER — oxyCODONE (ROXICODONE) immediate release tablet 10 mg
5 | ORAL | Status: AC | PRN
Start: 2016-10-26 — End: 2016-10-28

## 2016-10-26 MED ORDER — ondansetron (ZOFRAN) tablet 4 mg
4 | Freq: Three times a day (TID) | ORAL | Status: AC | PRN
Start: 2016-10-26 — End: 2016-10-28

## 2016-10-26 MED ORDER — magnesium hydroxide (MILK OF MAGNESIA) 2,400 mg/10 mL oral suspension 10 mL
2400 | Freq: Every day | ORAL | Status: AC | PRN
Start: 2016-10-26 — End: 2016-10-28

## 2016-10-26 MED ORDER — cefTRIAXone (ROCEPHIN) 1 g in sodium chloride 0.9% 100 mL ADDaptor IVPB
INTRAVENOUS | Status: AC
Start: 2016-10-26 — End: 2016-10-28
  Administered 2016-10-26 – 2016-10-28 (×3): 1 g via INTRAVENOUS

## 2016-10-26 MED ORDER — pantoprazole (PROTONIX) EC tablet 40 mg
40 | Freq: Every day | ORAL | Status: AC
Start: 2016-10-26 — End: 2016-10-28
  Administered 2016-10-26 – 2016-10-28 (×3): 40 mg via ORAL

## 2016-10-26 MED ORDER — oxyCODONE (ROXICODONE) immediate release tablet 5 mg
5 | ORAL | Status: AC | PRN
Start: 2016-10-26 — End: 2016-10-28

## 2016-10-26 MED ORDER — metoprolol tartrate (LOPRESSOR) tablet 50 mg
50 | Freq: Two times a day (BID) | ORAL | Status: AC
Start: 2016-10-26 — End: 2016-10-28
  Administered 2016-10-26 – 2016-10-28 (×5): 50 mg via ORAL

## 2016-10-26 MED ORDER — citalopram (CELEXA) tablet 40 mg
40 | Freq: Every day | ORAL | Status: AC
Start: 2016-10-26 — End: 2016-10-28
  Administered 2016-10-26 – 2016-10-28 (×3): 40 mg via ORAL

## 2016-10-26 MED ORDER — ondansetron (ZOFRAN) injection 4 mg
4 | INTRAMUSCULAR | Status: AC | PRN
Start: 2016-10-26 — End: 2016-10-28

## 2016-10-26 MED ORDER — heparin (porcine) injection 5,000 Units
5000 | Freq: Three times a day (TID) | INTRAMUSCULAR | Status: AC
Start: 2016-10-26 — End: 2016-10-28
  Administered 2016-10-26 – 2016-10-28 (×7): 5000 [IU] via SUBCUTANEOUS

## 2016-10-26 MED ORDER — multivitamin with folic acid tablet 1 tablet
400 | Freq: Every day | ORAL | Status: AC
Start: 2016-10-26 — End: 2016-10-26

## 2016-10-26 MED ORDER — sodium chloride 0.9 % infusion
INTRAVENOUS | Status: AC
Start: 2016-10-26 — End: 2016-10-28
  Administered 2016-10-26: 17:00:00 100 mL/h via INTRAVENOUS
  Administered 2016-10-26: 07:00:00 125 mL/h via INTRAVENOUS
  Administered 2016-10-27 – 2016-10-28 (×5): 100 mL/h via INTRAVENOUS

## 2016-10-26 MED ORDER — acetaminophen (TYLENOL) tablet 650 mg
325 | ORAL | Status: AC | PRN
Start: 2016-10-26 — End: 2016-10-28
  Administered 2016-10-26 – 2016-10-28 (×5): 650 mg via ORAL

## 2016-10-26 MED ORDER — levothyroxine (SYNTHROID, LEVOTHROID) tablet 50 mcg
50 | Freq: Every morning | ORAL | Status: AC
Start: 2016-10-26 — End: 2016-10-28
  Administered 2016-10-26 – 2016-10-28 (×3): 50 ug via ORAL

## 2016-10-26 MED ORDER — amLODIPine (NORVASC) tablet 5 mg
5 | Freq: Every day | ORAL | Status: AC
Start: 2016-10-26 — End: 2016-10-28
  Administered 2016-10-26 – 2016-10-28 (×3): 5 mg via ORAL

## 2016-10-26 MED ORDER — multivitamin with minerals (THERA-M) tablet
9 | Freq: Every day | ORAL | Status: AC
Start: 2016-10-26 — End: 2016-10-28
  Administered 2016-10-26 – 2016-10-28 (×3): 1 via ORAL

## 2016-10-26 MED FILL — CYCLOSPORINE MODIFIED 25 MG CAPSULE: 25 25 MG | ORAL | Qty: 3

## 2016-10-26 MED FILL — METOPROLOL TARTRATE 50 MG TABLET: 50 50 MG | ORAL | Qty: 1

## 2016-10-26 MED FILL — HEPARIN (PORCINE) 5,000 UNIT/ML INJECTION SOLUTION: 5000 5,000 unit/mL | INTRAMUSCULAR | Qty: 1

## 2016-10-26 MED FILL — TYLENOL 325 MG TABLET: 325 325 mg | ORAL | Qty: 2

## 2016-10-26 MED FILL — CITALOPRAM 40 MG TABLET: 40 40 MG | ORAL | Qty: 1

## 2016-10-26 MED FILL — SODIUM CHLORIDE 0.9 % INTRAVENOUS SOLUTION: 100.00 100.00 mL/hr | INTRAVENOUS | Qty: 1000

## 2016-10-26 MED FILL — THERA-M 9 MG IRON-400 MCG TABLET: 9 9 mg iron-400 mcg | ORAL | Qty: 1

## 2016-10-26 MED FILL — TYLENOL 325 MG TABLET: 325 325 mg | ORAL | Qty: 1

## 2016-10-26 MED FILL — LEVOTHYROXINE 50 MCG TABLET: 50 50 MCG | ORAL | Qty: 1

## 2016-10-26 MED FILL — SODIUM CHLORIDE 0.9 % INTRAVENOUS PIGGYBACK: 1.00 1.00 g | INTRAVENOUS | Qty: 100

## 2016-10-26 MED FILL — PANTOPRAZOLE 40 MG TABLET,DELAYED RELEASE: 40 40 MG | ORAL | Qty: 1

## 2016-10-26 MED FILL — AMLODIPINE 5 MG TABLET: 5 5 MG | ORAL | Qty: 1

## 2016-10-26 MED FILL — CEFTRIAXONE 1 GRAM SOLUTION FOR INJECTION: 1 1 gram | INTRAMUSCULAR | Qty: 1

## 2016-10-26 NOTE — Unmapped (Signed)
[facility]  H and P dict # [id number]      Acute UTI early sepsis in immunocompromised host   - outpt urine cx from 8/8 is growing Ecoli  Renal Transplant on cyclosporin  AKI with Cr 1.77 (baseline < 1 per pt)  HTN  Hypothyroid  Anx/depression  GERD      Inpt admit to SDU for now  IVF's  IV rocephin  f/u on outpt UCx and blood and urine cx's done in ER  Cont cyclosporin for now  Watch Cr closely.  May need nephro consult  Cont other home meds      Full code  Hep q 8hrs DVT proph      See orders    Jacinta Shoe, DO

## 2016-10-26 NOTE — Unmapped (Signed)
Problem: Safety  Goal: Patient will be injury free during hospitalization  Assess and monitor vitals signs, neurological status including level of consciousness and orientation. Assess patient's risk for falls and implement fall prevention plan of care and interventions per hospital policy.      Ensure arm band on, uncluttered walking paths in room, adequate room lighting, call light and overbed table within reach, bed in low position, wheels locked, side rails up per policy, and non-skid footwear provided.    Outcome: Progressing

## 2016-10-26 NOTE — Unmapped (Signed)
[facility]    Notes reviewed, pt seen and examined.  Feeling a little better.  Some n, no v.  Afebrile since ER.  Urinating.  Ucx w/ E.coli, sens to roceph.  c/s renal for immunosuppressive management.  Creat improving, back off IVF a bit.  Increase level of activity.  Sepsis improving.  Recheck lab.  Out of SDU, to [facility].  RRR/CTAB/abd benign.        Levana Minetti, DO, [facility]  10/26/2016 12:19 PM

## 2016-10-26 NOTE — Unmapped (Signed)
--  CASE MANAGEMENT/SOCIAL WORK DISCHARGE PLANNING CHART SCREEN--    CM/SW following patient.    Hospital day # 1 for evaluation and treatment of complicated UTI.    Per chart screen:  Patient is from home with significant other.    Patient was independent prior to admission.  Patient does have a significant medical history - see H&P.    Patient does have medical insurance coverage.    Payor: MEDICARE / Plan: MEDICARE A AND B / Product Type: Medicare /     Patient's Pharmacy:       [id number] - [place], [place] - [address] AT [address] of [facility] [address]  [address]  [place] [address]  Phone: [phone]     Patient's PCP:   Merri Ray, MD    Most recent VS:  BP 121/49 (BP Location: Left arm, Patient Position: Lying)    Pulse 74    Temp 98.6 ??F (37 ??C) (Axillary)    Resp 18    Ht 5' 9 (1.753 m)    Wt (!) 245 lb (111.1 kg)    SpO2 97%    BMI 36.18 kg/m??      BUN/Creat 27/1.66    WBC 9.8 today - decreased from 12.7 yesterday.  Tmax this morning 103.1.  Receiving scheduled IV Rocephin.  Blood cultures of 8/9 - NGTD.  Urine culture + E. Coli.    CM/SW will continue to follow and remain available for changing discharge needs.    --DISCHARGE PLAN--    Return home independently with significant other.  No discharge needs identified at this time.      Cleon Gustin, RN CCM  Cell [phone]

## 2016-10-26 NOTE — Unmapped (Signed)
[place]                              [facility]     PATIENT NAME:   Cindy, Ramirez               MRN: [id number]  DATE OF BIRTH:  1950-05-26                     CSN: [id number]  ADMITTING:      Nida Boatman A. Logan Bores, D.O.            ADMIT DATE: 10/25/2016  DICTATED BY:    Nida Boatman A. Logan Bores, D.O.                                  HISTORY AND PHYSICAL     PRIMARY CARE PHYSICIAN: Dr. Merri Ray.     CHIEF COMPLAINT: Urinary tract infection.     HISTORY OF PRESENT ILLNESS: A 66 year old female who has somewhat of a  complex medical history mostly stemming around she had polycystic kidney  disease so she had a renal transplant done in 2000 and so she is on both  chronic immunosuppressive medicine with cyclosporine and then also on chronic  antibiotics for UTI suppression in the form of Macrobid.  The patient says  she ran out of Macrobid about a month ago and she was doing okay as far as  not having any infectious signs or symptoms but she said about a week or so  ago she was having some left shoulder pain so she saw an orthopod and they  gave her a shot of cortisone in her left shoulder.  It helped the shoulder  but she thinks it maybe precipitated some of these symptoms that she is  having now.  For the last 3 or 4 days she started having progressive fever,  increasing urination, some mild dysuria at times, foul-smelling urine.  She  actually saw Dr. Gilman Buttner on Wednesday, she is being admitted Friday morning,  but she saw Dr. Gilman Buttner on Wednesday.  She did some blood work and she sent  off a urine culture, and it is already looking like it is growing 50,000 to  100,000 colonies of E coli.  There are no sensitivities back from it yet but  the patient has had frequent urinary tract infections in the past obviously  and she said it's always E coli.  She has obviously had some resistance in  the past because she tells me she has been seen by Infectious Disease, etc.  So when  she continued to spike fevers they called Dr. Lavonda Jumbo office back  and she said you better go into the ER.  She came into the ER, they checked  her out.  She has a white count of 13,000.  Creatinine is slightly up from  her baseline.  Her baseline is less than 1 and she is 1.77.  The UA is still suspicious with leukocytes and white blood cells and blood  and so they got a new urine culture and blood cultures, gave her 1 g of  Rocephin, and asked me to admit her for kind of early sepsis in an  immunocompromised patient.  The patient denies any other associated acute  symptoms such as exquisite abdominal pain.  No chest pain, no shortness of  breath,  although she still is febrile even here to 103.     PAST MEDICAL HISTORY:  1. Hypertension.  2. Anxiety and depression.  3. Hypothyroidism.  4. Gastroesophageal reflux disease.  5. Polycystic kidney disease requiring renal transplant in July of 2000.  6. Remote squamous cell carcinoma of the vulva.     PAST SURGICAL HISTORY:  1. Gastric bypass.  2. 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  3. Detached retina surgery.     FAMILY HISTORY: Mother had a bleeding disorder, diabetes and congestive heart  failure.  Father had Parkinson's disease.     SOCIAL HISTORY: She is a nonsmoker, no drinker, no illicit drug use.     ALLERGIES:  1. Cipro.  2. Floxin.  3. CellCept.     MEDICATIONS: Current medications at home--  1. Tylenol as needed.  2. Norvasc 5 daily.  3. Celexa 40 daily.  4. Cyclosporine 75 twice a day.  5. Nexium 40 every morning.  6. Synthroid 50 mcg every morning.  7. Metoprolol 50 twice a day.  8. Multivitamin.  9. Macrodantin 100 at night chronically.  10. Vitamin B complex one tablet daily.     Please note, Dr. Gilman Buttner tried to start her on Cipro.  She has only had one  dose of that.     REVIEW OF SYSTEMS: As per history of present illness.  Otherwise, no current  headache, no blurry vision or double vision.  No dysphagia or dysarthria.  No  upper chest pain,  pressure or palpitations. No acute shortness of breath, no  upper abdominal pain, some lower discomfort.  No CVA tenderness.  She has  increased dysuria, increased frequency of urination.  No hematochezia, melena  or constipation. No focal new numbness, tingling or weakness, just  generalized.  No new skin rash, lumps or bumps.  No recent travel or trauma.  No over-the-counter herb supplement or diet pills.  No unintentional weight  loss.     PHYSICAL EXAMINATION:  VITAL SIGNS: She is febrile here at 103.1, heart rate is 89, respirations 19,  blood pressure 118/42, saturation is 97% on room air.  I guess down in the ER  she had an initial saturation that was kind of in the low 90s, they thought  it was a little soft so that is one of the reasons why they wanted to admit  her to stepdown.  GENERAL:  She is awake and alert.  A very pleasant female.  Ill-appearing but  nontoxic.  HEENT: Pupils equal and reactive to light.  Extraocular muscles are intact.  Mucous membranes are dry and tacky.  NECK: Neck is thick from body habitus.  No obvious jugular venous distention.  HEART: Regular rate and rhythm, distant heart sounds. No obvious murmur.  LUNGS: Clear to auscultation bilaterally without wheeze, rhonchi or rales.  ABDOMEN: Soft.  Positive bowel sounds. Minimal tenderness over the suprapubic  area but no rebound, rigidity or guarding.  No CVA tenderness.  EXTREMITIES: Trace edema but nonpitting.  No calf tenderness.  Feet are warm.  Distal pulses are 2+ and bounding.  Strength is 5/5 x4.  NEUROLOGIC: Nonfocal.  Cranial nerves II through XII are intact.     LABORATORY DATA: Done in the emergency room: CBC shows white count is  slightly evaluated at 13,000, H&H 12 and 34, platelets 215; slight left  shift--82 neutrophils but no bands.  Renal panel showed sodium was 135,  potassium 3.9, chloride 105, CO2 20, BUN 34, creatinine 1.77,  sugar 129,  calcium 8.3, magnesium 2, phosphorus 2.  UA as mentioned has 30 of  protein,  small blood, large leukocytes and 95 white blood cells.  Blood culture x2 is  pending.  Urine culture from the ER is pending.  Urine culture from the  office as mentioned shows 50,000 to 100,000 colonies of E coli.  Sensitivity  is pending.     ER also did a chest x-ray which was negative; no acute cardiopulmonary  process was identified.     ASSESSMENT: A 66 year old female, multiple medical problems, presents with:  1. Acute urinary tract infection with early sepsis syndrome in an     immunocompromised host. Outpatient urine culture from August 8 is growing     E coli.  2. Status post renal transplant, on immunosuppressed medicine with     cyclosporine.  3. Acute kidney injury with creatinine of 1.77 on admission.  Baseline is     less than 1 per the patient.  4. Hypertension.  5. Hypothyroidism.  6. Anxiety and depression.  7. Gastroesophageal reflux disease.     PLAN:  1. Admit the patient inpatient. Given her co-morbids of being     immunosuppressed and a renal transplant with hypertension, she has a white     count, she has a fever, she is clearly not safe to be managed outside the     hospital.  She needs IV antibiotics, IV fluids, and close watch of her     renal function and infectious issues.  Estimated length of stay is 2 or 3     nights in the hospital.  2. IV fluid hydration.  3. IV Rocephin.  4. Follow-up on the outpatient cultures and sensitivities, make sure we are     not dealing with a resistant E coli here.  5. I will continue her cyclosporine now but if she continues to spike a     fever, white count goes up or shows any kind of resistance, we may have to     get Renal and/or Infectious Disease consult to help [place] with this complex     patient.  6. Continue her other home medications.  7. She is a full code.  8. I will put her on heparin every 8 for deep venous thrombosis prophylaxis.  9. Further workup and orders pending the above.                                                Jaimon Bugaj A.  Darene Lamer  D:  10/26/2016 02:38  T:  10/26/2016 04:18  Job #:  [id number]     c:   Sudie Grumbling, M.D.     HISTORY AND PHYSICAL                                         PAGE    1 of   1

## 2016-10-26 NOTE — Unmapped (Signed)
Problem: Daily Care  Goal: Daily care needs are met  Assess and monitor ability to perform self care and identify potential discharge needs.   Outcome: Progressing

## 2016-10-26 NOTE — Unmapped (Signed)
[facility]  Nephrology Consult    Asked to see Cindy Ramirez for AKI and h/o kidney transplant.  Patient is a 66 y.o. female with medical history as below.  She presents with a several day h/o of progressive fever, increased urination and dysuria.  She saw her PCP on Wednesday with urine culture growing 50-100k cfu ec.coli.  Her renal function is worse.      Past Medical History:  h/o kidney transplant   - secondary to PKD   - in 2000 living donor   - nephrologist is in [place], but she has not seen x 1 year  Recurrent UTI   - on macrobid  HTN  Hypothyroidism  h/o gastric bypass      Review of Systems:  Otherwise negative.  Medications:  (see list)  Allergies   Allergen Reactions   ??? Mycophenolate Mofetil Hives          Social History:  No tobacco.  She is not native here.  She came up here because her mother was sick, then in nursing home, and then died.  She is still here because she is getting her mother's affairs in order, and she needs to work on Proofreader.  Family History:  Negative for kidney disease    Physical Exam:  Blood pressure 128/64, pulse 83, temperature 100.6 ??F (38.1 ??C), temperature source Oral, resp. rate 16, height 5' 9 (1.753 m), weight (!) 245 lb (111.1 kg), SpO2 98 %.  General appearance: NAD, ill-appearing, obese body habitus  HEENT:  normocephalic, atraumatic, PERRL, EOMI, no sinus tenderness  Neck:  Supple, no lymphadenopathy, thyroid unremarkable  Chest:  CTAB, no wheezes, good air movement  Back:  Full range of motion, no tenderness  CVS:  RRR, no murmurs, no rubs  Abdomen:  NTND, soft, +BS, no masses or organomegaly, mild tenderness over transplanted kidney (this is typical when she has the UTI)  Extremities:  No pedal edema, no clubbing or cyanosis  Skin:  No rash, normal coloration and turgor  Musculoskeletal:  No joint tenderness, deformity, or swelling  Neurologic:  non-focal, normal speech  Psychiatric:  A+Ox3, judgement intact, recall  intact    Labs:  Lab Results   Component Value Date    WBC 9.8 10/26/2016    HGB 10.4 (L) 10/26/2016    HCT 30.4 (L) 10/26/2016    MCV 92.6 10/26/2016    PLT 187 10/26/2016     Lab Results   Component Value Date    NA 140 10/26/2016    K 3.8 10/26/2016    CL 110 10/26/2016    CO2 23 10/26/2016    BUN 27 (H) 10/26/2016    CREATININE 1.66 (H) 10/26/2016    CALCIUM 8.2 (L) 10/26/2016    PHOS 2.9 10/26/2016    MG 2.0 10/26/2016     Assessment and Plan:  1) AKI   - ddx:  Hemodynamic vs. ATN vs. pyelonephritis   - check urine studies   - agree with volume resuscitation    2) h/o kidney transplant   - baseline Cr 0.8-0.9   - check cyclosporine level   - as long as renal function continues to improve, I do not believe a kidney biopsy is necessary to evaluate her transplanted kidney tenderness   - if renal function does not return to baseline at the time of discharge, she should follow-up with one of my partners in the area    3) ID    -  on ceftriaxone    -

## 2016-10-26 NOTE — ED Notes (Signed)
Report called to stepdown nurse [phone]

## 2016-10-26 NOTE — Unmapped (Signed)
Problem: Fall Prevention  Goal: Patient will remain free of falls  Assess and monitor vitals signs, neurological status including level of consciousness and orientation.  Reassess fall risk per hospital policy.    Ensure arm band on, uncluttered walking paths in room, adequate room lighting, call light and overbed table within reach, bed in low position, wheels locked, side rails up per policy (excluding SNF), and non-skid footwear provided.    Outcome: Progressing  .    Intervention: Keep call light within reach  .    Intervention: Keep bed in low position  .    Intervention: Offer toileting every 2 hours  .    Intervention: Fall risk sign in place  .    Intervention: Fall risk bracelet in place  .    Intervention: Bed/chair alarm turned on  .

## 2016-10-26 NOTE — Unmapped (Signed)
Medication Reconciliation  [place] [facility]    Patient Name: Cindy Ramirez [birth year]    Medication reconciliation has been completed by: Pharmacy Intern    Source(s) of information:  Patient, CareEverywhere, and SureScript     Primary Care Physician: Merri Ray, MD    Pharmacy:   [facility] [id number] - [place], [place] - [address] AT [address] of [facility] [address]  [address]  [place] [address]  Phone: [phone]       Allergies   Allergen Reactions   ??? Mycophenolate Mofetil Hives       Prior to Admission medications taking for visit date 10/25/16   Medication Sig Taking? Authorizing Provider   acetaminophen (TYLENOL) 500 MG tablet Take 500 mg by mouth every 4 hours as needed.           Yes Historical Provider, MD   amLODIPine (NORVASC) 5 MG tablet Take 5 mg by mouth daily. Yes Historical Provider, MD   ciprofloxacin HCl (CIPRO) 500 MG tablet Take 500 mg by mouth 2 times a day. Antibiotic Therapy:  Indication: Kidney Infection  Last dose taken: 10/25/2016 pm  When did antibiotic start? 10/24/2016 afternoon  When is antibiotic therapy to STOP? 11/06/2016 Yes Historical Provider, MD   citalopram (CELEXA) 40 MG tablet Take 40 mg by mouth daily. Yes Historical Provider, MD   cycloSPORINE modified (NEORAL/GENGRAF) 25 MG capsule Take 75 mg by mouth 2 times a day. Yes Historical Provider, MD   esomeprazole (NEXIUM) 20 MG capsule Take 20 mg by mouth daily. Yes Historical Provider, MD   levothyroxine (SYNTHROID, LEVOTHROID) 50 MCG tablet Take 50 mcg by mouth every morning before breakfast. Yes Historical Provider, MD   metoprolol tartrate (LOPRESSOR) 50 MG tablet Take 50 mg by mouth 2 times a day. Yes Historical Provider, MD   multivitamin [facility]) tablet Take 1 tablet by mouth daily. Yes Historical Provider, MD   nitrofurantoin (MACRODANTIN) 100 MG capsule Take 100 mg by mouth at bedtime. Antibiotic Therapy:  Indication: Kidney Transplant  Last dose taken: 10/25/2016  When  did antibiotic start? Year of 2000  When is antibiotic therapy to STOP? Indefinitely Yes Historical Provider, MD   VITAMIN B COMPLEX ORAL Take 1 tablet by mouth daily. Yes Historical Provider, MD         Medications flagged for removal (include reason, ex. noncompliance, medication cost, therapy complete etc.):    ?? Ciprofloxacin, nitrofurantoin, esomeprazole, vitamin B- Delete. Replaced.    Other notes:    - none    To my knowledge the above medication reconciliation is accurate as of 10/26/2016 2:00 PM.        Manuela Neptune, PharmD Candidate 2019  10/26/2016 2:00 PM  [phone]

## 2016-10-26 NOTE — Unmapped (Signed)
[place] [facility]  Medical NutritionTherapy    Reason(s) for Completion: Physician/Nursing Referral- Other (weight gain) and Obesity     Diet Order: Cardiac (low fat, salt, cholesterol)    Chief Complaint: UTI     Pertinent Information: Patient is a 66 y.o. Female. PMHx: HTN, anxiety, depression, hypothyroidism, GERD, polycystic kidney disease requiring renal transplant in 2000, gastric bypass, craniectomy in 2010 after a subdural bleed.  Principal Problem: UTI (urinary tract infection).     RD spoke with patient. She reported consuming omelette with mushrooms, swiss cheese and [place] sausage for breakfast today. No c/o chewing/swallowing. No c/o n/v/c/d. PTA 2-3 meals/day (bbq chx, peas, baked potato). Pt reported 20 lb wt gain in the past 1 year which she attributed to moving, stress, and overall change in eating and living pattern. RD reviewed current cardiac diet in place and offered additional nutrition education. Patient accepted.      Provided written and oral General Healthful Nutrition Therapy. Discussed food groups, servings sizes and hand method, sodium, fats, and cholesterol guidelines. No questions from patient. Left patient with Daily Food Choices and Pantry List for Healthy Eating handouts. Contact card was also provided for any questions after d/c.     Patient Active Problem List   Diagnosis   ??? History of kidney transplant   ??? Hypothyroidism   ??? Depression   ??? History of subdural hematoma   ??? History of gastric bypass   ??? UTI (urinary tract infection)     Past Medical History:   Diagnosis Date   ??? Cancer (CMS Dx) 12/2007    SKIN   ??? Cancer (CMS Dx) 10/2008    SKIN   ??? Concussion 06/2009   ??? Detached retina 07/2004    LEFT EYE   ??? Dialysis patient (CMS Dx) 01/1998    9 MONTHS   ??? Hemorrhage in the brain (CMS Dx) 09/2009   ??? Hypertension    ??? Peripheral neuropathy 05/2005   ??? Polycystic kidney        Scheduled Meds:   ??? amLODIPine  5 mg Oral Daily 0900   ??? cefTRIAXone (ROCEPHIN) IVPB  1 g  Intravenous Q24H   ??? citalopram  40 mg Oral Daily 0900   ??? cycloSPORINE modified (NEORAL)  75 mg Oral BID   ??? heparin (porcine)  5,000 Units Subcutaneous 3 times per day   ??? levothyroxine  50 mcg Oral QAM AC   ??? metoprolol tartrate  50 mg Oral BID   ??? multivit-iron-FA-calcium-mins  1 tablet Oral Daily 0900   ??? pantoprazole  40 mg Oral DAILY 0600      Continuous Infusions:  ??? sodium chloride 0.9 % 125 mL/hr (10/26/16 0254)      PRN Meds:acetaminophen, magnesium hydroxide, ondansetron **OR** ondansetron, oxyCODONE **OR** oxyCODONE     Pertinent Labs:   Lab Results   Component Value Date    CREATININE 1.66 (H) 10/26/2016    BUN 27 (H) 10/26/2016    NA 140 10/26/2016    K 3.8 10/26/2016    CL 110 10/26/2016    CO2 23 10/26/2016     Lab Results   Component Value Date    CALCIUM 8.2 (L) 10/26/2016    PHOS 2.9 10/26/2016     Lab Results   Component Value Date    MG 2.0 10/26/2016     Lab Results   Component Value Date    GLUCOSE 122 (H) 10/26/2016     Lab Results  Component Value Date    WBC 9.8 10/26/2016     Lab Results   Component Value Date    ALBUMIN 2.8 (L) 10/26/2016     No results found for: PREALBUMIN  No results found for: CRP    Temp (24hrs), Avg:100.9 ??F (38.3 ??C), Min:98.5 ??F (36.9 ??C), Max:103.1 ??F (39.5 ??C)       Skin Integrity:    Edema: None reported at this time.    Braden Score: 21     GI:No BMs recorded at this time.     Potential Nutrition Related Factor(s):  Appetite Change  Food Allergies/Intolerances: NKFA  Cultural Requests: none at this time.     66 y.o.   Female   Ht Readings from Last 1 Encounters:   10/25/16 5' 9 (1.753 m)     Wt Readings from Last 10 Encounters:   10/26/16 (!) 245 lb (111.1 kg)   10/24/16 (!) 242 lb 12.8 oz (110.1 kg)   10/16/16 (!) 225 lb (102.1 kg)   02/03/16 (!) 226 lb (102.5 kg)   07/31/12 216 lb (98 kg)   07/25/12 216 lb (98 kg)   07/17/12 216 lb (98 kg)      Body mass index is 36.18 kg/m??.   BMI Class: obese   Ideal Body Weight (+/- 10%) 145 lb   Weight History: See  above. Patient appears with 19-20 lb wt gain within the past  9 mo.     Estimated Nutrition Needs:   Based on: 111 kg MSJ + 1.2 AF  Kcals:1724-2069 kcals/day   Protein: 111 Gms/day 1 gm/kg   Carbohydrates: 45-65% calories from cho   Fluid: ~37ml/kcal or per MD      Nutrition Related Problems:   Nutrition Diagnosis: Obesity   Related To: predicted hx of excessive energy intake   As Evidenced By: BMI 36.18    Recommended Interventions: Monitor PO Intake/Tolerance and Education/Counseling: General Healthful Nutrition Therapy  Goals:Total energy intake improved as evidenced by PO intake at least 75% of meals/supplements/snacks within 4-6 days and Voices/demonstrates knowledge of General Healthful Nutrition Therapy education/counseling                Nutrition Status Classification: Mildly Compromised                                                  Follow up per policy while inpatient     Nutrition Transition of Care Plan: Discharge plan of care for nutrition ongoing pending clinical course.     Recommendation(s) to Physician:   1. Continue Cardiac diet. Add 1900-2100 kcal CCHO diet.   2. Monitor PO intake/tolerance, weights, labs, and need for further nutrition intervention/education.     Estrellita Ludwig, MS, RD, LD  [phone]

## 2016-10-26 NOTE — Unmapped (Signed)
D. Pt is on the bed, Alert and oriented x4. She denies pain. No respiratory distress.      A. Febrile, PRN Tylenol given, IVF at 125 cc/hr infusing. Assessment completed, see docflow sheet for details. Administered medications as ordered and she swallowed fine. Updated patient on the plan of care.  Attended needs. Call light within reach.     R. Pt is resting comfortably. Will monitor temperature.

## 2016-10-26 NOTE — Unmapped (Signed)
Problem: Discharge Planning  Goal: Identify discharge needs  Outcome: Progressing

## 2016-10-27 LAB — BASIC METABOLIC PANEL
Anion Gap: 6 mmol/L (ref 3–16)
BUN: 21 mg/dL (ref 7–25)
CO2: 22 mmol/L (ref 21–33)
Calcium: 8.3 mg/dL (ref 8.6–10.3)
Chloride: 112 mmol/L (ref 98–110)
Creatinine: 1.4 mg/dL (ref 0.60–1.30)
Glucose: 98 mg/dL (ref 70–100)
Osmolality, Calculated: 293 mOsm/kg (ref 278–305)
Potassium: 3.8 mmol/L (ref 3.5–5.3)
Sodium: 140 mmol/L (ref 133–146)
eGFR AA CKD-EPI: 46 See note.
eGFR NONAA CKD-EPI: 40 See note.

## 2016-10-27 LAB — CBC
Hematocrit: 29.3 % — ABNORMAL LOW (ref 35.0–45.0)
Hemoglobin: 9.9 g/dL — ABNORMAL LOW (ref 11.7–15.5)
MCH: 31.6 pg (ref 27.0–33.0)
MCHC: 33.8 g/dL (ref 32.0–36.0)
MCV: 93.4 fL (ref 80.0–100.0)
MPV: 7.6 fL (ref 7.5–11.5)
Platelets: 195 10*3/uL (ref 140–400)
RBC: 3.14 10*6/uL — ABNORMAL LOW (ref 3.80–5.10)
RDW: 13.4 % (ref 11.0–15.0)
WBC: 6.6 10*3/uL (ref 3.8–10.8)

## 2016-10-27 LAB — CYCLOSPORINE LEVEL: Cyclosporine, Blood: 72 ng/mL (ref 100–400)

## 2016-10-27 MED FILL — HEPARIN (PORCINE) 5,000 UNIT/ML INJECTION SOLUTION: 5000 5,000 unit/mL | INTRAMUSCULAR | Qty: 1

## 2016-10-27 MED FILL — THERA-M 9 MG IRON-400 MCG TABLET: 9 9 mg iron-400 mcg | ORAL | Qty: 1

## 2016-10-27 MED FILL — SODIUM CHLORIDE 0.9 % INTRAVENOUS SOLUTION: 100.00 100.00 mL/hr | INTRAVENOUS | Qty: 1000

## 2016-10-27 MED FILL — CEFTRIAXONE 1 GRAM SOLUTION FOR INJECTION: 1 1 gram | INTRAMUSCULAR | Qty: 1

## 2016-10-27 MED FILL — METOPROLOL TARTRATE 50 MG TABLET: 50 50 MG | ORAL | Qty: 1

## 2016-10-27 MED FILL — TYLENOL 325 MG TABLET: 325 325 mg | ORAL | Qty: 2

## 2016-10-27 MED FILL — PANTOPRAZOLE 40 MG TABLET,DELAYED RELEASE: 40 40 MG | ORAL | Qty: 1

## 2016-10-27 MED FILL — SODIUM CHLORIDE 0.9 % INTRAVENOUS PIGGYBACK: 1.00 1.00 g | INTRAVENOUS | Qty: 100

## 2016-10-27 MED FILL — LEVOTHYROXINE 50 MCG TABLET: 50 50 MCG | ORAL | Qty: 1

## 2016-10-27 MED FILL — AMLODIPINE 5 MG TABLET: 5 5 MG | ORAL | Qty: 1

## 2016-10-27 MED FILL — CYCLOSPORINE MODIFIED 25 MG CAPSULE: 25 25 MG | ORAL | Qty: 3

## 2016-10-27 MED FILL — CITALOPRAM 40 MG TABLET: 40 40 MG | ORAL | Qty: 1

## 2016-10-27 NOTE — Unmapped (Signed)
[facility]                                                          [address]                                                               [address]                                                       [place], [place] [address]                                                Phone Number: [phone]                                                  Fax Number: [phone]                                          Nephrology Attending Progress Note      SUBJECTIVE:  We are following this patient for AKI.  The patient feels good .   ROS No CP/SOB/DOE or urinary symptoms .     Scheduled Meds:  ??? amLODIPine  5 mg Oral Daily 0900   ??? cefTRIAXone (ROCEPHIN) IVPB  1 g Intravenous Q24H   ??? citalopram  40 mg Oral Daily 0900   ??? cycloSPORINE modified (NEORAL)  75 mg Oral BID   ??? heparin (porcine)  5,000 Units Subcutaneous 3 times per day   ??? levothyroxine  50 mcg Oral QAM AC   ??? metoprolol tartrate  50 mg Oral BID   ??? multivit-iron-FA-calcium-mins  1 tablet Oral Daily 0900   ??? pantoprazole  40 mg Oral DAILY 0600     Continuous Infusions:  ??? sodium chloride 0.9 % 100 mL/hr (10/27/16 0644)     PRN Meds:.acetaminophen, magnesium hydroxide, ondansetron **OR** ondansetron, oxyCODONE **OR** oxyCODONE    Physical Exam:    TEMPERATURE:  Current - Temp: 99.1 ??F (37.3 ??C); Max - Temp  Avg: 99.6 ??F (37.6 ??C)  Min: 98.6 ??F (37 ??C)  Max: 101 ??F (38.3 ??C)  RESPIRATIONS RANGE: Resp  Avg: 16.3  Min: 14  Max: 18  PULSE RANGE: Pulse  Avg: 76  Min: 71  Max: 83  BLOOD PRESSURE RANGE:  Systolic (24hrs), Avg:129 , Min:120 , Max:141   ; Diastolic (24hrs), Avg:65, Min:49, Max:76    24HR INTAKE/OUTPUT:    Intake/Output Summary (Last 24 hours) at 10/27/16 1043  Last data filed at 10/27/16 [phone]  Gross per 24 hour   Intake             3759 ml   Output             1150 ml   Net             2609 ml       General appearance: alert, appears stated age and cooperative  Head:  Normocephalic, atraumatic  Nose: Nares normal.   Neck: no carotid bruit, no JVD, supple  Lungs: clear to auscultation bilaterally  Heart: regular rate and rhythm, no rubs  Abdomen: soft, non-tender; bowel sounds present  Extremities: trace  edema  Neurologic: Grossly normal  No tenderness on graft .     LAB DATA:    CBC:   Recent Labs      10/25/16   2308  10/26/16   0539  10/27/16   0854   WBC  12.7*  9.8  6.6   HGB  11.7  10.4*  9.9*   HCT  33.8*  30.4*  29.3*   PLT  215  187  195      No results found for: IRON, TIBC, FERRITIN  No results found for: VITAMINB12  No results found for: FOLATE  BMP:  Recent Labs      10/25/16   2308  10/26/16   0539  10/27/16   0854   NA  135  140  140   K  3.9  3.8  3.8   CL  105  110  112*   CO2  20*  23  22   BUN  34*  27*  21   CREATININE  1.77*  1.66*  1.40*     Lab Results   Component Value Date    CALCIUM 8.3 (L) 10/27/2016    PHOS 2.9 10/26/2016     Magnesium:    Lab Results   Component Value Date    MG 2.0 10/26/2016     No results found for: PHART, PCO2, PO2ART, HCO3ART, BEART, HBO2PER, O2SATART  Lab Results   Component Value Date    HGBA1C 4.7 (L) 02/03/2016       No results found for: ANA  No results found for: ANCA  No results found for: C3  No results found for: C4  U/A:  Lab Results   Component Value Date    NITRITE Negative 10/25/2016    NITRITE negative 10/24/2016    COLORU Yellow 10/25/2016    PHUR 6.0 10/25/2016    PHUR 6.0 10/24/2016    WBCUA 95 (H) 10/25/2016    RBCUA 3 10/25/2016    CLARITYU Clear 10/25/2016    LEUKOCYTESUR Large (A) 10/25/2016    LEUKOCYTESUR moderate 10/24/2016    UROBILINOGEN <2.0 10/25/2016    UROBILINOGEN 0.2 10/24/2016    BILIRUBINUR Negative 10/25/2016    BILIRUBINUR negative 10/24/2016    BLOODU Small (A) 10/25/2016    BLOODU trace 10/24/2016    GLUCOSEU Negative 10/25/2016    GLUCOSEU negative 10/24/2016    KETONESU Negative 10/25/2016    KETONESU negative 10/24/2016       No results found for: NAUR, KUR, CLUR    No results found for:  MICROALBUR, MALB24HUR    No results found for: SPEP, UPEP    IMPRESSION/RECOMMENDATIONS:      Principal Problem:    UTI (urinary tract infection)    A/p.  1.AKI improving with IVF/Abx .  2. UTI on abx .   3.H/o  kidney Txp on meds . Cya level p.   4.HTN stable .   Dc plan per admitting team .advised neph f/u post dc .               SIGNED BY: Evelina Bucy

## 2016-10-27 NOTE — Unmapped (Signed)
Received this pt awake, alert, oriented, easy breathing, on Ra, complained of headache-Tylenol given.  Maintained pt on Ns at 100 ml/he on the R fa iv site, will send urine for crea and etc. As ordered from yesterday. Pt denied any complaints.

## 2016-10-27 NOTE — Unmapped (Signed)
Problem: Daily Care  Goal: Daily care needs are met  Assess and monitor ability to perform self care and identify potential discharge needs.   Outcome: Progressing

## 2016-10-27 NOTE — Unmapped (Signed)
Problem: Fall Prevention  Goal: Patient will remain free of falls  Assess and monitor vitals signs, neurological status including level of consciousness and orientation.  Reassess fall risk per hospital policy.    Ensure arm band on, uncluttered walking paths in room, adequate room lighting, call light and overbed table within reach, bed in low position, wheels locked, side rails up per policy (excluding SNF), and non-skid footwear provided.    Outcome: Progressing      Problem: Daily Care  Goal: Daily care needs are met  Assess and monitor ability to perform self care and identify potential discharge needs.   Outcome: Progressing

## 2016-10-27 NOTE — Unmapped (Signed)
[facility] HOSPITALIST PROGRESS NOTE    Name: Cindy Ramirez    DOB: Oct 24, 1950   MRN: [id number]   Admit Date: 10/25/2016       Patient class : Inpatient    Assessment & Plan:    -  Sepsis d/t UTI, txp kidney, immunosuppressed, E.coli; repeat Ucx/bld cx neg; cont roceph  -  ARenF- improving; cont IVF  -  Body mass index is 36.18 kg/m??.  Obesity  -  HTN  -  Hypothyroidism  -  anx/dep  -  GERD    Recheck lab  Poss home tomorrow pending lab, progress    Subjective:      Feeling better    Physical Exam:    BP 137/76 (BP Location: Left arm, Patient Position: Lying)    Pulse 71    Temp 99.1 ??F (37.3 ??C) (Oral)    Resp 14    Ht 5' 9 (1.753 m)    Wt (!) 245 lb (111.1 kg)    SpO2 97%    BMI 36.18 kg/m??        GEN: NAD  HEENT: EOMI; no icterus or injection; mucus membranes moist  CV: RRR; no murmurs  PULM: CTAB; nonlabored  GI: normoactive bowel sounds; soft; nontender; nondistended  EXT: no edema    Labs/ Imaging:    Reviewed      Ezzard Standing, DO, SFHM  Pager [phone]  10/27/2016, 1:40 PM

## 2016-10-27 NOTE — Unmapped (Signed)
Problem: Fall Prevention  Goal: Patient will remain free of falls  Assess and monitor vitals signs, neurological status including level of consciousness and orientation.  Reassess fall risk per hospital policy.    Ensure arm band on, uncluttered walking paths in room, adequate room lighting, call light and overbed table within reach, bed in low position, wheels locked, side rails up per policy (excluding SNF), and non-skid footwear provided.    Outcome: Progressing  Bed in the lowest position, pt steady on her feet, denied need for walker or cane, advised to call if with needs.

## 2016-10-28 LAB — RENAL FUNCTION PANEL W/EGFR
Albumin: 2.7 g/dL — ABNORMAL LOW (ref 3.5–5.7)
Anion Gap: 7 mmol/L (ref 3–16)
BUN: 24 mg/dL (ref 7–25)
CO2: 18 mmol/L — ABNORMAL LOW (ref 21–33)
Calcium: 8 mg/dL — ABNORMAL LOW (ref 8.6–10.3)
Chloride: 115 mmol/L — ABNORMAL HIGH (ref 98–110)
Creatinine: 1.24 mg/dL (ref 0.60–1.30)
Glucose: 93 mg/dL (ref 70–100)
Osmolality, Calculated: 294 mosm/kg (ref 278–305)
Phosphorus: 3.1 mg/dL (ref 2.1–4.7)
Potassium: 4 mmol/L (ref 3.5–5.3)
Sodium: 140 mmol/L (ref 133–146)
eGFR AA CKD-EPI: 53 See note.
eGFR NONAA CKD-EPI: 46 See note.

## 2016-10-28 LAB — UREA NITROGEN, URINE: Urea Nitrogen, Ur: 407 mg/dL

## 2016-10-28 LAB — CREATININE, URINE, RANDOM: Creatinine, Urine: 48.43 mg/dL

## 2016-10-28 LAB — SODIUM, URINE, RANDOM: Sodium, Ur: 96 mmol/L

## 2016-10-28 MED ORDER — nitrofurantoin (MACRODANTIN) 100 MG capsule
100 | Freq: Every evening | ORAL | 0 refills | Status: AC
Start: 2016-10-28 — End: 2019-12-21

## 2016-10-28 MED ORDER — cefUROXime (CEFTIN) 250 MG tablet
250 | ORAL_TABLET | Freq: Two times a day (BID) | ORAL | 0 refills | 5.00000 days | Status: AC
Start: 2016-10-28 — End: 2016-11-01

## 2016-10-28 MED FILL — LEVOTHYROXINE 50 MCG TABLET: 50 50 MCG | ORAL | Qty: 1

## 2016-10-28 MED FILL — CEFTRIAXONE 1 GRAM SOLUTION FOR INJECTION: 1 1 gram | INTRAMUSCULAR | Qty: 1

## 2016-10-28 MED FILL — HEPARIN (PORCINE) 5,000 UNIT/ML INJECTION SOLUTION: 5000 5,000 unit/mL | INTRAMUSCULAR | Qty: 1

## 2016-10-28 MED FILL — CITALOPRAM 40 MG TABLET: 40 40 MG | ORAL | Qty: 1

## 2016-10-28 MED FILL — AMLODIPINE 5 MG TABLET: 5 5 MG | ORAL | Qty: 1

## 2016-10-28 MED FILL — CYCLOSPORINE MODIFIED 25 MG CAPSULE: 25 25 MG | ORAL | Qty: 3

## 2016-10-28 MED FILL — SODIUM CHLORIDE 0.9 % INTRAVENOUS SOLUTION: 100.00 100.00 mL/hr | INTRAVENOUS | Qty: 1000

## 2016-10-28 MED FILL — SODIUM CHLORIDE 0.9 % INTRAVENOUS PIGGYBACK: 1.00 1.00 g | INTRAVENOUS | Qty: 100

## 2016-10-28 MED FILL — THERA-M 9 MG IRON-400 MCG TABLET: 9 9 mg iron-400 mcg | ORAL | Qty: 1

## 2016-10-28 MED FILL — METOPROLOL TARTRATE 50 MG TABLET: 50 50 MG | ORAL | Qty: 1

## 2016-10-28 MED FILL — PANTOPRAZOLE 40 MG TABLET,DELAYED RELEASE: 40 40 MG | ORAL | Qty: 1

## 2016-10-28 NOTE — Unmapped (Signed)
Patient is discharged.  Nurse went over discharge instructions with the patient as well as medications and follow up appointments..  Patient verbalized full understanding and has no further questions for me at this time.  Prescription was given to the patient for the antibiotic.  Patient's spouse is picking patient up and transporting her home.  Clovis Pu, RN

## 2016-10-28 NOTE — Unmapped (Signed)
Problem: Daily Care  Goal: Daily care needs are met  Assess and monitor ability to perform self care and identify potential discharge needs.   Outcome: Progressing

## 2016-10-28 NOTE — Unmapped (Signed)
[facility]                                                          [address]                                                               [address]                                                       [place], [place] [address]                                                Phone Number: [phone]                                                  Fax Number: [phone]                                          Nephrology Attending Progress Note      SUBJECTIVE:  We are following this patient for AKI.  The patient feels good . Events noted .   ROS No CP/SOB/DOE or urinary symptoms .     Scheduled Meds:  ??? amLODIPine  5 mg Oral Daily 0900   ??? cefTRIAXone (ROCEPHIN) IVPB  1 g Intravenous Q24H   ??? citalopram  40 mg Oral Daily 0900   ??? cycloSPORINE modified (NEORAL)  75 mg Oral BID   ??? heparin (porcine)  5,000 Units Subcutaneous 3 times per day   ??? levothyroxine  50 mcg Oral QAM AC   ??? metoprolol tartrate  50 mg Oral BID   ??? multivit-iron-FA-calcium-mins  1 tablet Oral Daily 0900   ??? pantoprazole  40 mg Oral DAILY 0600     Continuous Infusions:  ??? sodium chloride 0.9 % 100 mL/hr (10/28/16 0830)     PRN Meds:.acetaminophen, magnesium hydroxide, ondansetron **OR** ondansetron, oxyCODONE **OR** oxyCODONE    Physical Exam:    TEMPERATURE:  Current - Temp: 98.8 ??F (37.1 ??C); Max - Temp  Avg: 98.5 ??F (36.9 ??C)  Min: 98.2 ??F (36.8 ??C)  Max: 98.8 ??F (37.1 ??C)  RESPIRATIONS RANGE: Resp  Avg: 15.5  Min: 14  Max: 16  PULSE RANGE: Pulse  Avg: 66.8  Min: 64  Max: 70  BLOOD PRESSURE RANGE:  Systolic (24hrs), Avg:130 , Min:120 , Max:137   ; Diastolic (24hrs), Avg:67, Min:58, Max:74    24HR INTAKE/OUTPUT:      Intake/Output Summary (Last 24 hours) at 10/28/16 1106  Last  data filed at 10/28/16 0831   Gross per 24 hour   Intake             3364 ml   Output             1300 ml   Net             2064 ml       General appearance: alert, appears stated age and  cooperative  Head: Normocephalic, atraumatic   Neck: no JVD, supple  Lungs: clear to auscultation bilaterally  Heart: regular rate and rhythm, no rubs  Abdomen: soft, non-tender; bowel sounds present  Extremities: trace  edema  No tenderness on graft .     LAB DATA:    CBC:   Recent Labs      10/25/16   2308  10/26/16   0539  10/27/16   0854   WBC  12.7*  9.8  6.6   HGB  11.7  10.4*  9.9*   HCT  33.8*  30.4*  29.3*   PLT  215  187  195      No results found for: IRON, TIBC, FERRITIN  No results found for: VITAMINB12  No results found for: FOLATE  BMP:    Recent Labs      10/26/16   0539  10/27/16   0854  10/28/16   0521   NA  140  140  140   K  3.8  3.8  4.0   CL  110  112*  115*   CO2  23  22  18*   BUN  27*  21  24   CREATININE  1.66*  1.40*  1.24     Lab Results   Component Value Date    CALCIUM 8.0 (L) 10/28/2016    PHOS 3.1 10/28/2016     Magnesium:    Lab Results   Component Value Date    MG 2.0 10/26/2016     No results found for: PHART, PCO2, PO2ART, HCO3ART, BEART, HBO2PER, O2SATART  Lab Results   Component Value Date    HGBA1C 4.7 (L) 02/03/2016       No results found for: ANA  No results found for: ANCA  No results found for: C3  No results found for: C4  U/A:    Lab Results   Component Value Date    NITRITE Negative 10/25/2016    NITRITE negative 10/24/2016    COLORU Yellow 10/25/2016    PHUR 6.0 10/25/2016    PHUR 6.0 10/24/2016    WBCUA 95 (H) 10/25/2016    RBCUA 3 10/25/2016    CLARITYU Clear 10/25/2016    LEUKOCYTESUR Large (A) 10/25/2016    LEUKOCYTESUR moderate 10/24/2016    UROBILINOGEN <2.0 10/25/2016    UROBILINOGEN 0.2 10/24/2016    BILIRUBINUR Negative 10/25/2016    BILIRUBINUR negative 10/24/2016    BLOODU Small (A) 10/25/2016    BLOODU trace 10/24/2016    GLUCOSEU Negative 10/25/2016    GLUCOSEU negative 10/24/2016    KETONESU Negative 10/25/2016    KETONESU negative 10/24/2016       Lab Results   Component Value Date    NAUR 96 10/27/2016       No results found for: MICROALBUR,  MALB24HUR    No results found for: SPEP, UPEP    IMPRESSION/RECOMMENDATIONS:      Principal Problem:    UTI (urinary tract infection)    A/p.  1.AKI improving with IVF/Abx .  2. UTI on abx .   3.H/o kidney Txp on meds . Cya level p.   4.HTN stable .   Dc plan per admitting team .advised neph f/u post dc .               SIGNED BY: Evelina Bucy

## 2016-10-28 NOTE — Unmapped (Signed)
[facility]  Discharge Summary      Patient's PCP:  Merri Ray, MD  Name: Cindy Ramirez  DOB: [birth year]  MRN: [id number]     Admit Date & Time:  10/25/2016 10:35 PM  Discharge Date: 10/28/2016  Patient Class: Inpatient    Admitting Physician:  Clarisa Schools, DO  Discharge Physician:  Ezzard Standing, DO, SFHM    Consults:  Dr Quillian Quince, renal    Diagnoses:  -  Sepsis d/t UTI, txp kidney, immunosuppressed, E.coli  -  ARenF  -  Body mass index is 36.18 kg/m??.  Obesity  -  HTN  -  Hypothyroidism  -  anx/dep  -  GERD    Hospital Course:  Admitted w/ sepsis, found to have UTI, immunosuppressed w/ kidney txp.  outpt Ucx w/ E.coli; repeat cx here NGTD.  Has responded well to IVF, roceph.  Creat much improved.  Feeling much better, no further fevers.  To finish 7d course abx (3 more days).  To f/u w/ renal locally.  See PCP 1 week.  Cont immunosuppression.  CPS level still pending....    On exam today: VSS/NAD/RRR/CTAB     Diagnostic Studies/Procedures:   X-ray Chest Pa And Lateral    Result Date: 10/25/2016  PA and Lateral chest dated .10/25/2016 11:06 PM EDT Indication: Fever, unspecified,  Comparison: None available. Findings: Surgical staples are seen in the left upper abdomen. There are multiple scattered calcified granulomas. Bandlike opacities in lung bases suggest atelectasis versus scar. The lungs are otherwise clear. There is no evidence of pneumothorax, or pleural effusions. The cardiac and mediastinal silhouettes are within normal limits. No acute bony abnormalities are appreciated.     IMPRESSION: No acute cardiopulmonary disease.  Report Verified by: Lana Fish, M.D. at 10/25/2016 11:29 PM EDT       Pending studies/lab:  CSP, renal to follow    Discharge Medications:      Medication List      TAKE these medications, which are NEW      Quantity/Refills   cefUROXime 250 MG tablet  Commonly known as:  CEFTIN  Take 1 tablet (250 mg total) by mouth 2 times a day for 3 days.  Start taking on:   10/29/2016   Quantity:  6 tablet  Refills:  0        TAKE these medications, which you were ALREADY TAKING      Quantity/Refills   acetaminophen 500 MG tablet  Commonly known as:  TYLENOL  Take 500 mg by mouth every 4 hours as needed.   Refills:  0     amLODIPine 5 MG tablet  Commonly known as:  NORVASC  Take 5 mg by mouth daily.   Refills:  0     citalopram 40 MG tablet  Commonly known as:  CELEXA  Take 40 mg by mouth daily.   Refills:  0     cycloSPORINE modified 25 MG capsule  Commonly known as:  NEORAL/GENGRAF  Take 75 mg by mouth 2 times a day.   Refills:  0     esomeprazole 20 MG capsule  Commonly known as:  NEXIUM  Take 20 mg by mouth daily.   Refills:  0     levothyroxine 50 MCG tablet  Commonly known as:  SYNTHROID, LEVOTHROID  Take 50 mcg by mouth every morning before breakfast.   Refills:  0     metoprolol tartrate 50 MG tablet  Commonly known as:  LOPRESSOR  Take 50 mg by mouth 2 times a day.   Refills:  0     multivitamin tablet  Commonly known as:  THERAGRAN  Take 1 tablet by mouth daily.   Refills:  0     nitrofurantoin 100 MG capsule  Commonly known as:  MACRODANTIN  Take 1 capsule (100 mg total) by mouth at bedtime. Antibiotic Therapy: Indication: Kidney Transplant Last dose taken: 10/25/2016 When did antibiotic start? Year of 2000 When is antibiotic therapy to STOP? Indefinitely  Start taking on:  11/01/2016   Refills:  0     VITAMIN B COMPLEX ORAL  Take 1 tablet by mouth daily.   Refills:  0        STOP taking these medications    ciprofloxacin HCl 500 MG tablet  Commonly known as:  CIPRO           Where to Get Your Medications      You can get these medications from any pharmacy    Bring a paper prescription for each of these medications  ?? cefUROXime 250 MG tablet     Information about where to get these medications is not yet available    Ask your nurse or doctor about these medications  ?? nitrofurantoin 100 MG capsule           Discharge Instructions:       Activity: activity as  tolerated    Diet: regular diet      Disposition: home    Follow Up:   Merri Ray, MD  [address]  IM/Peds  [place] [address]  [phone]    Schedule an appointment as soon as possible for a visit in 1 week      Verne Spurr, MD  [address]  [place] [address]  [phone]    Schedule an appointment as soon as possible for a visit in 2 weeks          Total time spent on this discharge was 32 minutes including time spent with the patient, coordinating care, reviewing data, and completing medical records.    Thank you for the opportunity to be involved in your patient's care. If you have any questions or concerns regarding this hospitalization, please feel free to give me a call at [phone].       Josh Nicolosi, DO, SFHM  10/28/2016, 3:37 PM

## 2016-10-28 NOTE — Unmapped (Signed)
Cefuroxime tablets  What is this medicine?  CEFUROXIME (se fyoor OX eem) is a cephalosporin antibiotic. It is used to treat certain kinds of bacterial infections. It will not work for colds, flu, or other viral infections.  This medicine may be used for other purposes; ask your health care provider or pharmacist if you have questions.  COMMON BRAND NAME(S): Alti-Cefuroxime, Ceftin  What should I tell my health care provider before I take this medicine?  They need to know if you have any of these conditions:  -bleeding problems  -bowel disease, like colitis  -kidney disease  -liver disease  -an unusual or allergic reaction to cefuroxime, other antibiotics or medicines, foods, dyes or preservatives  -pregnant or trying to get pregnant  -breast-feeding  How should I use this medicine?  Take this medicine by mouth with a full glass of water. Follow the directions on the prescription label. Do not crush or chew. This medicine works best if you take it with food. Take your medicine at regular intervals. Do not take your medicine more often than directed. Take all of your medicine as directed even if you think your are better. Do not skip doses or stop your medicine early.  Talk to your pediatrician regarding the use of this medicine in children. Special care may be needed. While this drug may be prescribed for children as young as 73 months of age for selected conditions, precautions do apply.  Overdosage: If you think you have taken too much of this medicine contact a poison control center or emergency room at once.  NOTE: This medicine is only for you. Do not share this medicine with others.  What if I miss a dose?  If you miss a dose, take it as soon as you can. If it is almost time for your next dose, take only that dose. Do not take double or extra doses.  What may interact with this medicine?  This medicine may interact with the following medications:  -antacids  -birth control pills  -certain medicines for infection  like amikacin, gentamicin, tobramycin  -diuretics  -probenecid  -warfarin  This list may not describe all possible interactions. Give your health care provider a list of all the medicines, herbs, non-prescription drugs, or dietary supplements you use. Also tell them if you smoke, drink alcohol, or use illegal drugs. Some items may interact with your medicine.  What should I watch for while using this medicine?  Tell your doctor or health care professional if your symptoms do not improve or if you get new symptoms.  Do not treat diarrhea with over the counter products. Contact your doctor if you have diarrhea that lasts more than 2 days or if it is severe and watery.  This medicine can interfere with some urine glucose tests. If you use such tests, talk with your health care professional.  If you are being treated for a sexually transmitted disease, avoid sexual contact until you have finished your treatment. Your sexual partner may also need treatment.  What side effects may I notice from receiving this medicine?  Side effects that you should report to your doctor or health care professional as soon as possible:  -allergic reactions like skin rash, itching or hives, swelling of the face, lips, or tongue  -dark urine  -difficulty breathing  -fever  -irregular heartbeat or chest pain  -redness, blistering, peeling or loosening of the skin, including inside the mouth  -seizures  -unusual bleeding or bruising  -unusually weak  or tired  -white patches or sores in the mouth  Side effects that usually do not require medical attention (report to your doctor or health care professional if they continue or are bothersome):  -diarrhea  -gas or heartburn  -headache  -nausea, vomiting  -vaginal itching  This list may not describe all possible side effects. Call your doctor for medical advice about side effects. You may report side effects to FDA at 1-800-FDA-1088.  Where should I keep my medicine?  Keep out of the reach of  children.  Store at room temperature between 15 and 30 degrees C (59 and 86 degrees F). Keep container tightly closed. Protect from moisture. Throw away any unused medicine after the expiration date.  NOTE: This sheet is a summary. It may not cover all possible information. If you have questions about this medicine, talk to your doctor, pharmacist, or health care provider.  ?? 2018 Elsevier/Gold Standard (2013-01-19 09:43:18)  Urinary Tract Infection, Adult  A urinary tract infection (UTI) is an infection of any part of the urinary tract, which includes the kidneys, ureters, bladder, and urethra. These organs make, store, and get rid of urine in the body. UTI can be a bladder infection (cystitis) or kidney infection (pyelonephritis).  What are the causes?  This infection may be caused by fungi, viruses, or bacteria. Bacteria are the most common cause of UTIs. This condition can also be caused by repeated incomplete emptying of the bladder during urination.  What increases the risk?  This condition is more likely to develop if:  ?? You ignore your need to urinate or hold urine for long periods of time.  ?? You do not empty your bladder completely during urination.  ?? You wipe back to front after urinating or having a bowel movement, if you are female.  ?? You are uncircumcised, if you are female.  ?? You are constipated.  ?? You have a urinary catheter that stays in place (indwelling).  ?? You have a weak defense (immune) system.  ?? You have a medical condition that affects your bowels, kidneys, or bladder.  ?? You have diabetes.  ?? You take antibiotic medicines frequently or for long periods of time, and the antibiotics no longer work well against certain types of infections (antibiotic resistance).  ?? You take medicines that irritate your urinary tract.  ?? You are exposed to chemicals that irritate your urinary tract.  ?? You are female.  What are the signs or symptoms?  Symptoms of this condition  include:  ?? Fever.  ?? Frequent urination or passing small amounts of urine frequently.  ?? Needing to urinate urgently.  ?? Pain or burning with urination.  ?? Urine that smells bad or unusual.  ?? Cloudy urine.  ?? Pain in the lower abdomen or back.  ?? Trouble urinating.  ?? Blood in the urine.  ?? Vomiting or being less hungry than normal.  ?? Diarrhea or abdominal pain.  ?? Vaginal discharge, if you are female.  How is this diagnosed?  This condition is diagnosed with a medical history and physical exam. You will also need to provide a urine sample to test your urine. Other tests may be done, including:  ?? Blood tests.  ?? Sexually transmitted disease (STD) testing.  If you have had more than one UTI, a cystoscopy or imaging studies may be done to determine the cause of the infections.  How is this treated?  Treatment for this condition often includes a combination of  two or more of the following:  ?? Antibiotic medicine.  ?? Other medicines to treat less common causes of UTI.  ?? Over-the-counter medicines to treat pain.  ?? Drinking enough water to stay hydrated.  Follow these instructions at home:  ?? Take over-the-counter and prescription medicines only as told by your health care provider.  ?? If you were prescribed an antibiotic, take it as told by your health care provider. Do not stop taking the antibiotic even if you start to feel better.  ?? Avoid alcohol, caffeine, tea, and carbonated beverages. They can irritate your bladder.  ?? Drink enough fluid to keep your urine clear or pale yellow.  ?? Keep all follow-up visits as told by your health care provider. This is important.  ?? Make sure to:  ?? Empty your bladder often and completely. Do not hold urine for long periods of time.  ?? Empty your bladder before and after sex.  ?? Wipe from front to back after a bowel movement if you are female. Use each tissue one time when you wipe.  Contact a health care provider if:  ?? You have back pain.  ?? You have a fever.  ?? You feel  nauseous or vomit.  ?? Your symptoms do not get better after 3 days.  ?? Your symptoms go away and then return.  Get help right away if:  ?? You have severe back pain or lower abdominal pain.  ?? You are vomiting and cannot keep down any medicines or water.  This information is not intended to replace advice given to you by your health care provider. Make sure you discuss any questions you have with your health care provider.  Document Released: 12/13/2004 Document Revised: 08/17/2015 Document Reviewed: 01/24/2015  Elsevier Interactive Patient Education ?? 2017 [employer].

## 2016-10-28 NOTE — Unmapped (Signed)
[facility]    Case Manager Discharge Summary     Patient name: Cindy Ramirez                                        Patient MRN: [id number]  DOB: 1951-01-23                              Age: 66 y.o.              Gender: female  Patient emergency contact: Extended Emergency Contact Information  Primary Emergency Contact: Doughterty,Susan   [place]  Mobile Phone: [phone]  Relation: Significant other      Attending provider: Ezzard Standing, DO  Primary care physician: Merri Ray, MD    The MD has indicated that the patient is ready for discharge. Chart reviewed at time of discharge. No needs identified.   Roxan Diesel will discharge home independently.   Transfer Mode/Level of Care: Family     Care Plan Completed: Yes    No further CM needs.    This plan has been reviewed with the multi-disciplinary team.

## 2016-10-29 NOTE — Unmapped (Signed)
Discharge from: inpatient unit  Discharge to: home  Communication within 2 days of discharge: yes  Medicine Reconciliation done: yes  Discharge summary reviewed: yes  Pending Test reviewed: n/a  Education provided to: n/a  Referrals scheduled: n/a  Community services done n/a    RN contacted patient for discharge follow-up. Patient reports doing good at present. Patient confirms she has obtained ordered antibiotic (Ceftin). Patient says Ceftin tastes bad but endorses tolerating antibiotic therapy. Denies nausea, diarrhea. Reports eating yogurt for probiotic benefit.     Patient reports feeling tired, hot but denies fevers. She says, I know it will take me awhile to get over this. Patient thinks she may have picked up a sinus infection in the hospital. Reports small amount of blood from nares. RN questioned patient about quantity, color of blood. Patient says, it's nothing bad and attributes finding to subcutaneous heparin given during hospital stay.     Tolerating PO intake without issue.     Patient underwent kidney transplant in 2000, follows with specialist in [place], [place]. Patient admits it has been a while since provider evaluated her. Says distance, travel time are definitely factors. Patient states she will schedule an appointment this week.    Medications reviewed one by one. Patient confirms accuracy.     Follow-up care discussed. Patient requests assist with securing appointment. Says any day is acceptable but prefers to be seen early next week (8/20) if possible. Prefers appointment between 10:00 AM and 4:00 PM if feasible. Patient aware RN will route message to clinical pool requesting outreach for appointment. Patient advised to contact RN and/or PCP office if no response by 3:30 PM today. Understanding verbalized.     Patient provided with RN name, contact number [phone] and office hours 8:00 AM to 4:30 PM Mon thru Fri. RN encouraged patient to call with questions, concerns.         RN routed telephone call to clinical pool at PCP office. See specific encounter for more information.     Newell Coral, BSN, RN  RN Care Coordinator  [facility]  [phone]  -------------------------------------------------------------------------------------------------------------------------------------------------  Current Outpatient Prescriptions   Medication Sig   ??? acetaminophen Take 500 mg by mouth every 4 hours as needed.             ??? amLODIPine Take 5 mg by mouth daily.   ??? cefUROXime Take 1 tablet (250 mg total) by mouth 2 times a day for 3 days.   ??? citalopram Take 40 mg by mouth daily.   ??? cycloSPORINE modified Take 75 mg by mouth 2 times a day.   ??? esomeprazole Take 20 mg by mouth daily.   ??? levothyroxine Take 50 mcg by mouth every morning before breakfast.   ??? metoprolol tartrate Take 50 mg by mouth 2 times a day.   ??? multivitamin Take 1 tablet by mouth daily.   ??? [START ON 11/01/2016] nitrofurantoin Take 1 capsule (100 mg total) by mouth at bedtime. Antibiotic Therapy:  Indication: Kidney Transplant  Last dose taken: 10/25/2016  When did antibiotic start? Year of 2000  When is antibiotic therapy to STOP? Indefinitely   ??? VITAMIN B COMPLEX ORAL Take 1 tablet by mouth daily.     No current facility-administered medications for this visit.      Discharge Summaries  Date of Service: 10/28/2016 2:43 PM     Ezzard Standing, DO   Emergency Medicine      [] Hide copied text    ??  [facility]  Discharge Summary  ??  ??  Patient's PCP:  Merri Ray, MD  Name: Cindy Ramirez                      DOB: 04-03-1950  MRN: [id number]           ??  Admit Date & Time:  10/25/2016 10:35 PM  Discharge Date: 10/28/2016  Patient Class: Inpatient  ??  Admitting Physician:  Clarisa Schools, DO  Discharge Physician:  Ezzard Standing, DO, SFHM  ??  Consults:  Dr Quillian Quince, renal  ??  Diagnoses:  - ??Sepsis d/t UTI, txp kidney, immunosuppressed, E.coli  - ??ARenF  - ??Body mass index is 36.18  kg/m??.????Obesity  - ??HTN  - ??Hypothyroidism  - ??anx/dep  - ??GERD  ??  Hospital Course:  Admitted w/ sepsis, found to have UTI, immunosuppressed w/ kidney txp.  outpt Ucx w/ E.coli; repeat cx here NGTD.  Has responded well to IVF, roceph.  Creat much improved.  Feeling much better, no further fevers.  To finish 7d course abx (3 more days).  To f/u w/ renal locally.  See PCP 1 week.  Cont immunosuppression.  CPS level still pending....  ??  On exam today: VSS/NAD/RRR/CTAB   ??  Diagnostic Studies/Procedures:   X-ray Chest Pa And Lateral  ??  Result Date: 10/25/2016  PA and Lateral chest dated .10/25/2016 11:06 PM EDT Indication: Fever, unspecified,  Comparison: None available. Findings: Surgical staples are seen in the left upper abdomen. There are multiple scattered calcified granulomas. Bandlike opacities in lung bases suggest atelectasis versus scar. The lungs are otherwise clear. There is no evidence of pneumothorax, or pleural effusions. The cardiac and mediastinal silhouettes are within normal limits. No acute bony abnormalities are appreciated.   ??  IMPRESSION: No acute cardiopulmonary disease.  Report Verified by: Lana Fish, M.D. at 10/25/2016 11:29 PM EDT     ??  Pending studies/lab:  CSP, renal to follow  ??  Discharge Medications:       ??   Medication List   ??       TAKE these medications, which are NEW      Quantity/Refills   cefUROXime 250 MG tablet  Commonly known as:  CEFTIN  Take 1 tablet (250 mg total) by mouth 2 times a day for 3 days.  Start taking on:  10/29/2016  ?? Quantity:  6 tablet  Refills:  0  ??       ??       TAKE these medications, which you were ALREADY TAKING      Quantity/Refills   acetaminophen 500 MG tablet  Commonly known as:  TYLENOL  Take 500 mg by mouth every 4 hours as needed.  ?? Refills:  0  ??   amLODIPine 5 MG tablet  Commonly known as:  NORVASC  Take 5 mg by mouth daily.  ?? Refills:  0  ??   citalopram 40 MG tablet  Commonly known as:  CELEXA  Take 40 mg by mouth daily.  ?? Refills:  0  ??    cycloSPORINE modified 25 MG capsule  Commonly known as:  NEORAL/GENGRAF  Take 75 mg by mouth 2 times a day.  ?? Refills:  0  ??   esomeprazole 20 MG capsule  Commonly known as:  NEXIUM  Take 20 mg by mouth daily.  ?? Refills:  0  ??   levothyroxine 50 MCG tablet  Commonly known as:  SYNTHROID, LEVOTHROID  Take 50 mcg by mouth every morning before breakfast.  ?? Refills:  0  ??   metoprolol tartrate 50 MG tablet  Commonly known as:  LOPRESSOR  Take 50 mg by mouth 2 times a day.  ?? Refills:  0  ??   multivitamin tablet  Commonly known as:  THERAGRAN  Take 1 tablet by mouth daily.  ?? Refills:  0  ??   nitrofurantoin 100 MG capsule  Commonly known as:  MACRODANTIN  Take 1 capsule (100 mg total) by mouth at bedtime. Antibiotic Therapy: Indication: Kidney Transplant Last dose taken: 10/25/2016 When did antibiotic start? Year of 2000 When is antibiotic therapy to STOP? Indefinitely  Start taking on:  11/01/2016  ?? Refills:  0  ??   VITAMIN B COMPLEX ORAL  Take 1 tablet by mouth daily.  ?? Refills:  0  ??   ??   STOP taking these medications    ciprofloxacin HCl 500 MG tablet  Commonly known as:  CIPRO  ??   ??   ??   Where to Get Your Medications   ??   You can get these medications from any pharmacy    Bring a paper prescription for each of these medications  ?? cefUROXime 250 MG tablet  ??   Information about where to get these medications is not yet available    Ask your nurse or doctor about these medications  ?? nitrofurantoin 100 MG capsule  ??   ??  ??  ??  Discharge Instructions:   ??  ??  Activity: activity as tolerated  ??  Diet: regular diet  ??  ??  Disposition: home  ??  Follow Up:   Merri Ray, MD  [address]  IM/Peds  [place] [address]  [phone]  ??  Schedule an appointment as soon as possible for a visit in 1 week  ??  ??  Verne Spurr, MD  [address]  [place] [address]  [phone]  ??  Schedule an appointment as soon as possible for a visit in 2 weeks  ??      ??  Total time spent on this  discharge was 32 minutes including time spent with the patient, coordinating care, reviewing data, and completing medical records.  ??  Thank you for the opportunity to be involved in your patient's care. If you have any questions or concerns regarding this hospitalization, please feel free to give me a call at [phone].   ??  ??  BRIAN  CLYMER, DO, SFHM  10/28/2016, 3:37 PM           ED to Hosp-Admission (Discharged) on 10/25/2016    ??       Detailed Report    ??      Note shared with patient

## 2016-10-29 NOTE — Telephone Encounter (Signed)
georgi please schedule next Monday at 2pm

## 2016-10-29 NOTE — Telephone Encounter (Signed)
Scheduled

## 2016-11-05 ENCOUNTER — Ambulatory Visit: Admit: 2016-11-05 | Payer: MEDICARE

## 2016-11-05 DIAGNOSIS — Z940 Kidney transplant status: Secondary | ICD-10-CM

## 2016-11-05 DIAGNOSIS — Z4822 Encounter for aftercare following kidney transplant: Secondary | ICD-10-CM

## 2016-11-05 LAB — RENAL FUNCTION PANEL W/EGFR
Albumin: 3.5 g/dL (ref 3.5–5.7)
Anion Gap: 8 mmol/L (ref 3–16)
BUN: 34 mg/dL (ref 7–25)
CO2: 25 mmol/L (ref 21–33)
Calcium: 9 mg/dL (ref 8.6–10.3)
Chloride: 105 mmol/L (ref 98–110)
Creatinine: 1.53 mg/dL (ref 0.60–1.30)
Glucose: 97 mg/dL (ref 70–100)
Osmolality, Calculated: 294 mOsm/kg (ref 278–305)
Phosphorus: 3.6 mg/dL (ref 2.1–4.7)
Potassium: 4.9 mmol/L (ref 3.5–5.3)
Sodium: 138 mmol/L (ref 133–146)
eGFR AA CKD-EPI: 41 See note.
eGFR NONAA CKD-EPI: 36 See note.

## 2016-11-05 LAB — CBC
Hematocrit: 36 % (ref 35.0–45.0)
Hemoglobin: 12.3 g/dL (ref 11.7–15.5)
MCH: 32.2 pg (ref 27.0–33.0)
MCHC: 34.2 g/dL (ref 32.0–36.0)
MCV: 94.1 fL (ref 80.0–100.0)
MPV: 7.2 fL (ref 7.5–11.5)
Platelets: 491 10*3/uL (ref 140–400)
RBC: 3.83 10*6/uL (ref 3.80–5.10)
RDW: 13.4 % (ref 11.0–15.0)
WBC: 5.8 10*3/uL (ref 3.8–10.8)

## 2016-11-05 NOTE — Unmapped (Signed)
[facility]  [facility]  [address]  [place] [address]    Name:  Cindy Ramirez Date of Birth: 1950/12/13 (66 y.o.)   MRN: [id number]    Date of Service:  11/05/2016     Subjective:     Chief Complaint   Patient presents with   ??? Adjustment To Illness/injury/hospitalization     10/25/16 [facility] Sepsis     History of Present Illness:  Cindy Ramirez is a(n) 66 y.o. female here today for the following:   HPI     Per pt feeling better, still having some fatigue  Had her mother's estate sale this weekend  Per pt no current urianry sx, overall is feeling well, no blood in urine eating and drinkingwell      Current Outpatient Prescriptions   Medication Sig Dispense Refill   ??? acetaminophen (TYLENOL) 500 MG tablet Take 500 mg by mouth every 4 hours as needed.               ??? amLODIPine (NORVASC) 5 MG tablet Take 5 mg by mouth daily.     ??? citalopram (CELEXA) 40 MG tablet Take 40 mg by mouth daily.     ??? cycloSPORINE modified (NEORAL/GENGRAF) 25 MG capsule Take 75 mg by mouth 2 times a day.     ??? esomeprazole (NEXIUM) 20 MG capsule Take 20 mg by mouth daily.     ??? levothyroxine (SYNTHROID, LEVOTHROID) 50 MCG tablet Take 50 mcg by mouth every morning before breakfast.     ??? metoprolol tartrate (LOPRESSOR) 50 MG tablet Take 50 mg by mouth 2 times a day.     ??? multivitamin (THERAGRAN) tablet Take 1 tablet by mouth daily.     ??? nitrofurantoin (MACRODANTIN) 100 MG capsule Take 1 capsule (100 mg total) by mouth at bedtime. Antibiotic Therapy:  Indication: Kidney Transplant  Last dose taken: 10/25/2016  When did antibiotic start? Year of 2000  When is antibiotic therapy to STOP? Indefinitely  0   ??? VITAMIN B COMPLEX ORAL Take 1 tablet by mouth daily.       No current facility-administered medications for this visit.       Review of Systems   Constitutional: Negative for chills and fever.   HENT: Negative for congestion and rhinorrhea.    Respiratory: Negative for cough and shortness of breath.     Cardiovascular: Negative for chest pain.   Genitourinary: Negative for difficulty urinating and dysuria.            Objective:     Vitals:    11/05/16 1401   BP: 126/78   BP Location: Right arm   Patient Position: Sitting   BP Cuff Size: Large   Pulse: 56   SpO2: 98%   Weight: (!) 241 lb 6.4 oz (109.5 kg)     Body mass index is 35.65 kg/m??.    Physical Exam   Constitutional: She appears well-developed and well-nourished. No distress.   Slightly pale   HENT:   Head: Normocephalic and atraumatic.   Right Ear: External ear normal.   Left Ear: External ear normal.   Mouth/Throat: Oropharynx is clear and moist. No oropharyngeal exudate.   Eyes: Conjunctivae are normal. Right eye exhibits no discharge. Left eye exhibits no discharge. No scleral icterus.   Neck: Neck supple.   Cardiovascular: Normal rate, regular rhythm, normal heart sounds and intact distal pulses.  Exam reveals no gallop and no  friction rub.    No murmur heard.  Pulmonary/Chest: Effort normal. No respiratory distress. She has no wheezes.   Abdominal: Soft. She exhibits no distension and no mass. There is no tenderness. There is no rebound and no guarding.   Musculoskeletal: She exhibits no edema.   Lymphadenopathy:     She has no cervical adenopathy.   Neurological: She is alert. Coordination normal.   Skin: Skin is warm and dry. No rash noted.            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

## 2016-11-09 NOTE — Telephone Encounter (Signed)
Pt needs labs ordered to monitor renal function 2/2 appointment with nephrologist

## 2016-11-12 NOTE — Unmapped (Signed)
Addended by: Olivia Mackie E on: 11/12/2016 01:01 PM     Modules accepted: Orders

## 2016-11-13 ENCOUNTER — Other Ambulatory Visit: Admit: 2016-11-13 | Payer: MEDICARE

## 2016-11-13 DIAGNOSIS — N189 Chronic kidney disease, unspecified: Secondary | ICD-10-CM

## 2016-11-13 LAB — URINALYSIS, REFLEX TO CULTURE
Bilirubin, UA: NEGATIVE
Blood, UA: NEGATIVE
Glucose, UA: NEGATIVE mg/dL
Ketones, UA: NEGATIVE mg/dL
Leukocytes, UA: NEGATIVE
Nitrite, UA: NEGATIVE
Protein, UA: 100 mg/dL
RBC, UA: 1 /HPF (ref 0–3)
Specific Gravity, UA: 1.017 (ref 1.005–1.035)
Squam Epithel, UA: 2 /HPF (ref 0–5)
Urobilinogen, UA: <2 mg/dL (ref 0.2–1.9)
WBC, UA: 2 /HPF (ref 0–5)
pH, UA: 6 (ref 5.0–8.0)

## 2016-11-13 LAB — RENAL FUNCTION PANEL, FASTING
Albumin: 3.8 g/dL (ref 3.5–5.7)
Anion Gap: 9 mmol/L (ref 3–16)
BUN: 32 mg/dL (ref 7–25)
CO2: 24 mmol/L (ref 21–33)
Calcium: 9.4 mg/dL (ref 8.6–10.3)
Chloride: 107 mmol/L (ref 98–110)
Creatinine: 1.4 mg/dL (ref 0.60–1.30)
Glucose, Fasting: 93 mg/dL (ref 70–100)
Osmolality, Calculated: 297 mOsm/kg (ref 278–305)
Phosphorus: 4.1 mg/dL (ref 2.1–4.7)
Potassium: 4.2 mmol/L (ref 3.5–5.3)
Sodium: 140 mmol/L (ref 133–146)
eGFR AA CKD-EPI: 46 See note.
eGFR NONAA CKD-EPI: 40 See note.

## 2016-11-13 LAB — CBC
Hematocrit: 37.2 % (ref 35.0–45.0)
Hemoglobin: 12.4 g/dL (ref 11.7–15.5)
MCH: 31.4 pg (ref 27.0–33.0)
MCHC: 33.2 g/dL (ref 32.0–36.0)
MCV: 94.3 fL (ref 80.0–100.0)
MPV: 7.6 fL (ref 7.5–11.5)
Platelets: 422 10*3/uL (ref 140–400)
RBC: 3.95 10*6/uL (ref 3.80–5.10)
RDW: 13.4 % (ref 11.0–15.0)
WBC: 6.7 10*3/uL (ref 3.8–10.8)

## 2016-11-13 LAB — CREATININE, URINE, RANDOM: Creatinine, Urine: 95.6 mg/dL

## 2016-11-13 LAB — SODIUM, URINE, RANDOM: Sodium, Ur: 86 mmol/L

## 2016-11-13 LAB — CYCLOSPORINE LEVEL: Cyclosporine, Blood: 54 ng/mL (ref 100–400)

## 2016-11-14 NOTE — Telephone Encounter (Signed)
Faxed to [phone] There phone number is [phone]

## 2016-11-14 NOTE — Unmapped (Signed)
Cindy Ramirez can we make sure we fax a copy of Cindy Ramirez's recent blood work to her nephrologist in [place]  Dr. Ewell Poe whose phone # is Phone ??[phone] in [place], [place]

## 2017-01-01 MED ORDER — nitrofurantoin (MACRODANTIN) 100 MG capsule
100 | ORAL_CAPSULE | ORAL | 5 refills | Status: AC
Start: 2017-01-01 — End: 2019-12-21

## 2018-12-15 IMAGING — DX FOOT 3 VIEWS LEFT
1 series · 3 of 3 positions shown · non-contrast
Comparison: none

# F 
FOOT 3 VIEWS LEFT, 12/15/2018 [DATE]: 
CLINICAL INDICATION:  Follow-up left foot fracture. Patient tripped and fell on 
11/06/2018. 
COMPARISON EXAMINATIONS: None

[Series 1: AP · U · 0.10mm/px · 3 of 3 slices shown]
[im 1/3]
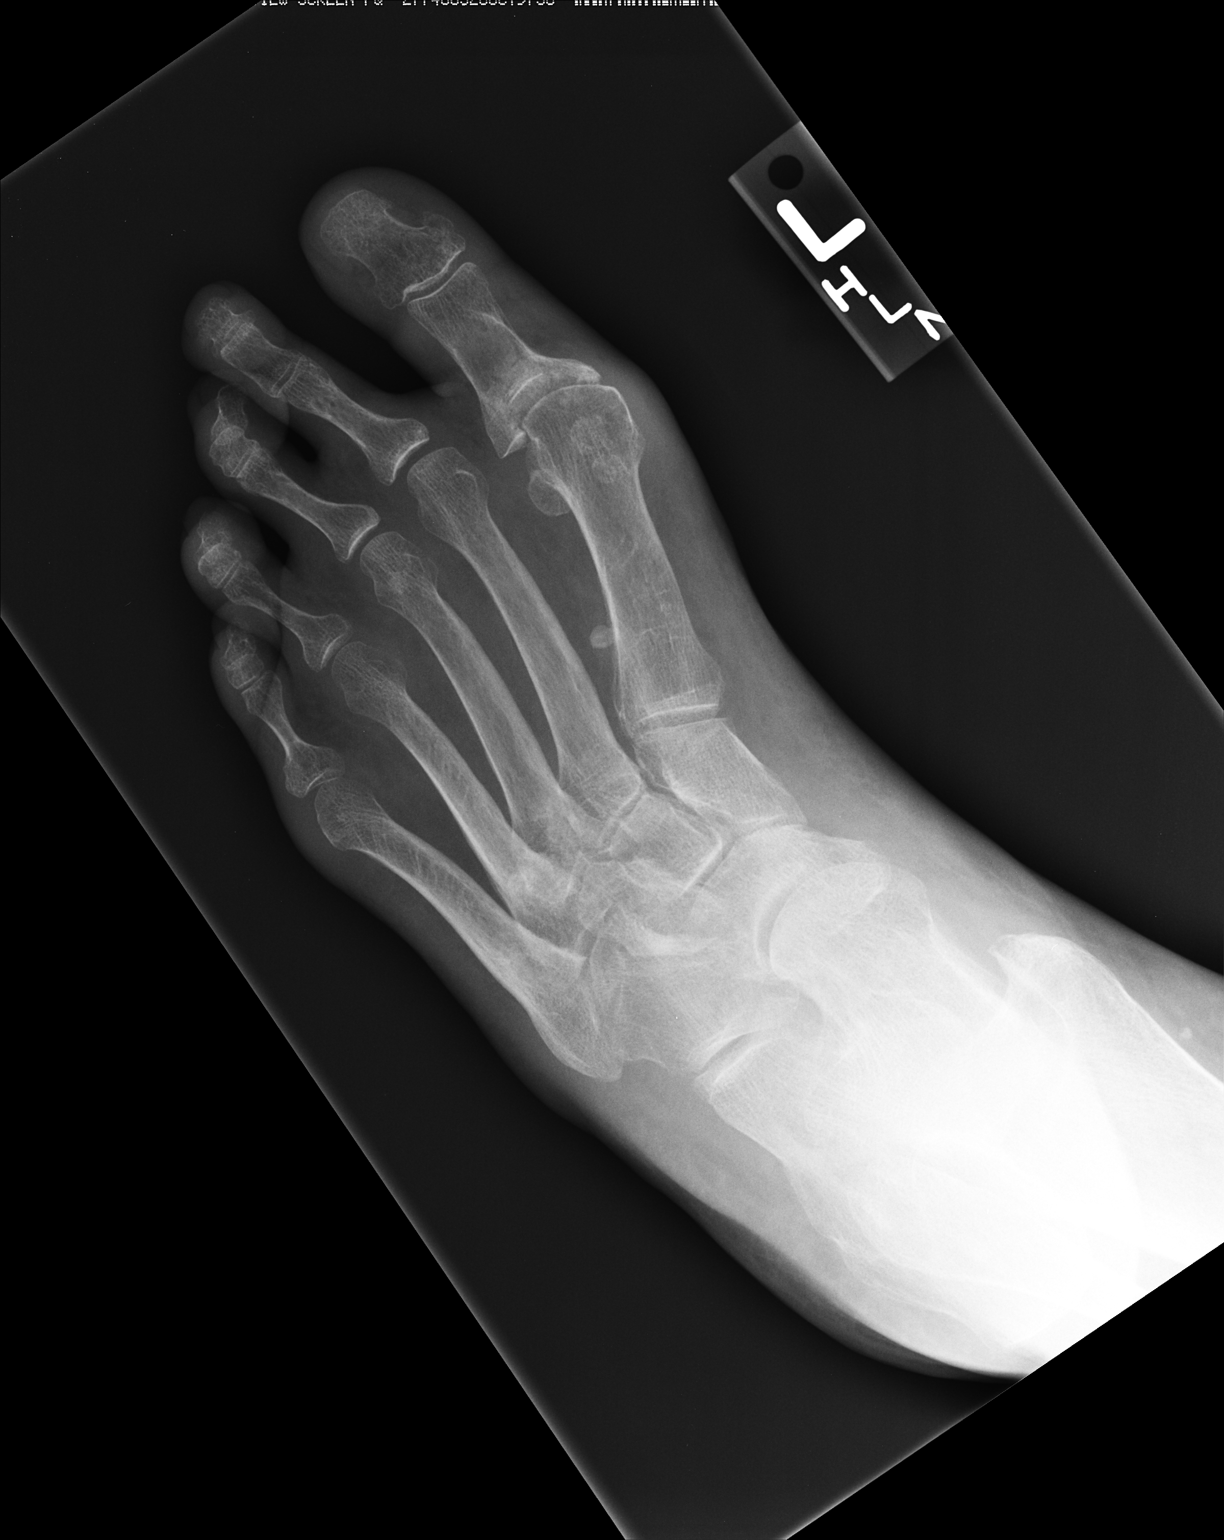
[im 2/3]
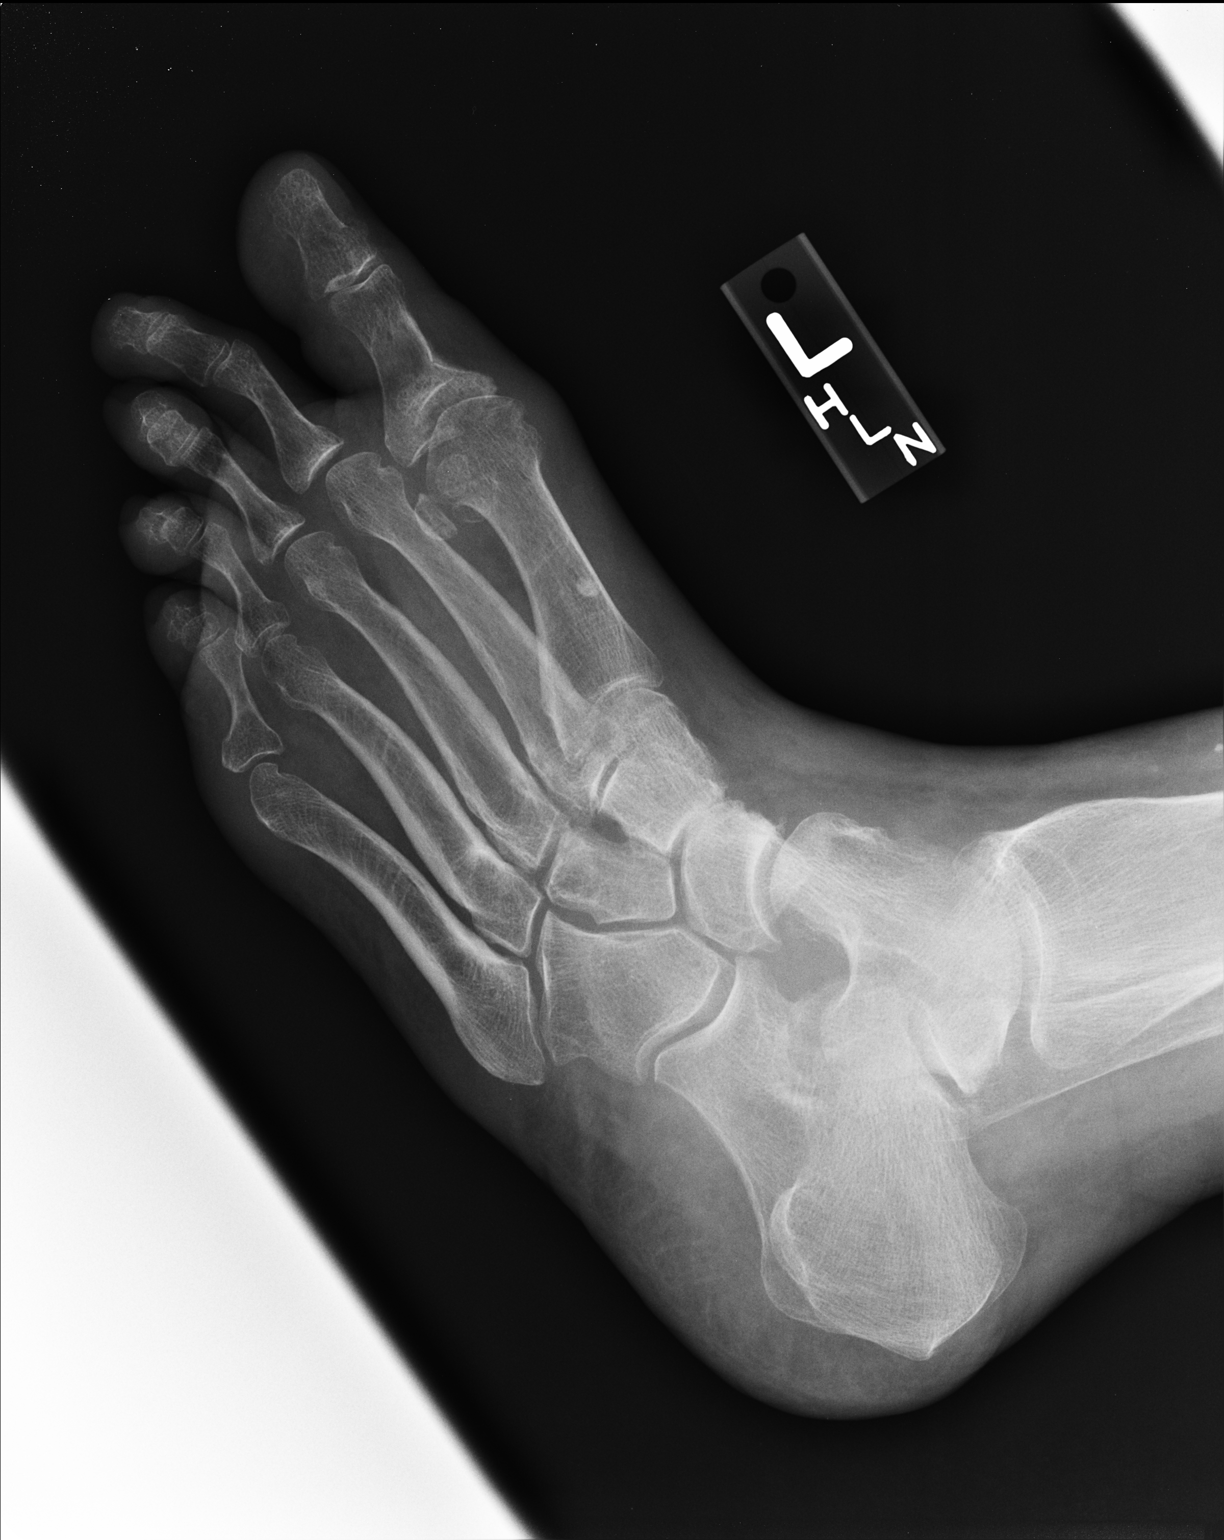
[im 3/3]
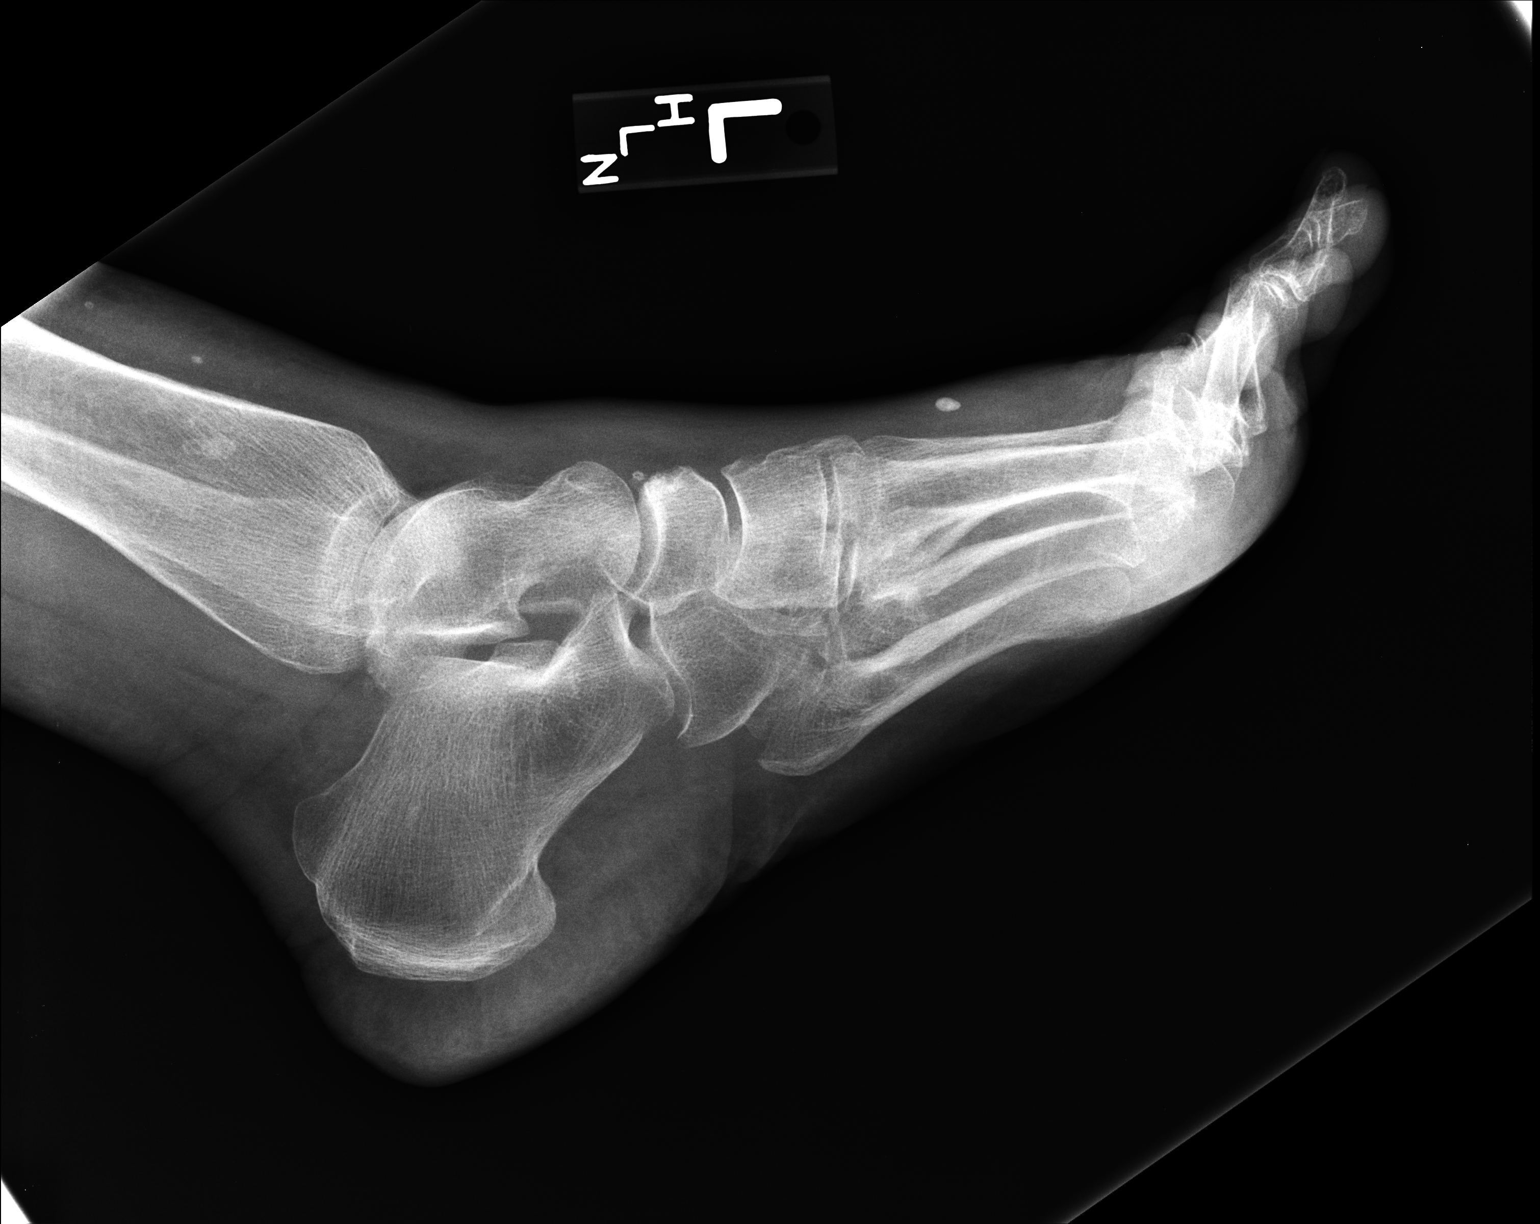

[3 of 3 positions shown; findings below may reference images not displayed]

FINDINGS: Subacute, comminuted and impacted intra-articular fracture through 
the hallux proximal phalangeal base and proximal shaft. Up to 5 mm impaction of 
the central base fracture fragments. Small amount of callus formation. Mild 
degenerative change of the midfoot. Osteopenia. Mild soft tissue swelling. 
Several phleboliths.
IMPRESSION: 1. Subacute, comminuted and impacted intra-articular fracture through the hallux 
proximal phalangeal base and proximal shaft. 
2. Osteopenia: DXA may be helpful for further evaluation.

## 2019-03-16 IMAGING — CT CT NECK WITHOUT CONTRAST
4 of 5 series · 14 of 33 positions shown, 16 images · non-contrast
Comparison: Comparison was made to the prior exam(s) within the last 12 months 
dated  CT neck Jaildo Mergen February 09, 2019

CT NECK WITHOUT CONTRAST, 03/16/2019 [DATE]: 
CLINICAL INDICATION:  Lymphadenopathy, follow-up evaluation 
A search for DICOM formatted images was conducted for prior CT imaging studies 
completed at a non-affiliated media free facility.
TECHNIQUE: The neck was scanned from the level of the maxillary sinuses through 
the AP Window without contrast on a high-resolution CT scanner using dose 
reduction techniques. Routine MPR reconstructions were performed.

[Series 2: neck 2.0 b31s · axial · 0.44mm/px · z∈[-240,-78]mm · 4 of 135 slices shown, 5 images]
[im 27/135  soft-tissue]
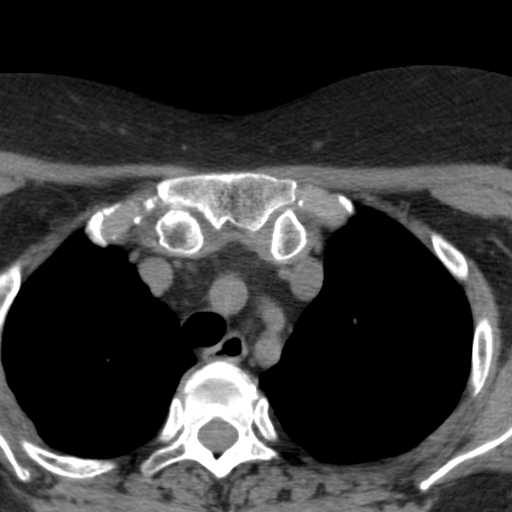
[im 27/135  bone]
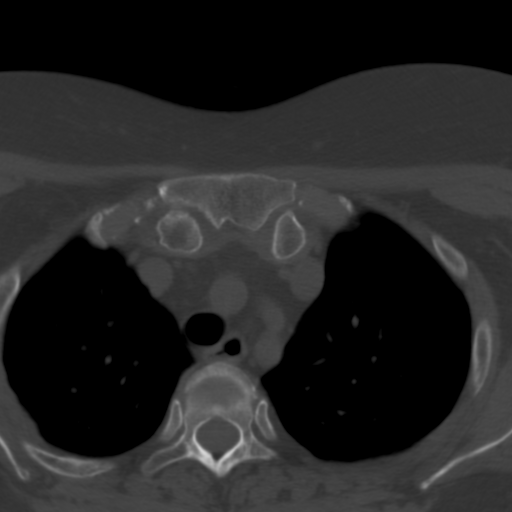
[im 54/135  bone]
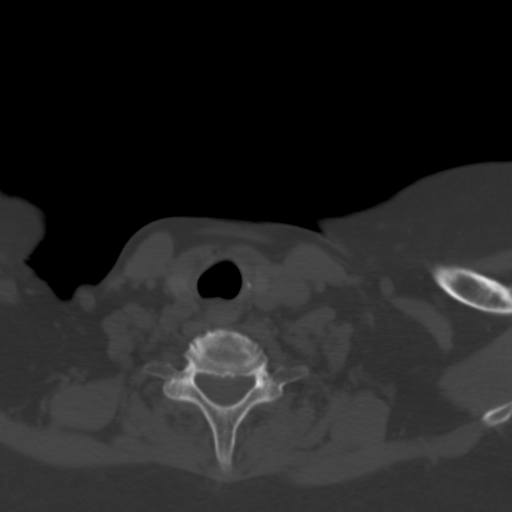
[im 81/135  bone]
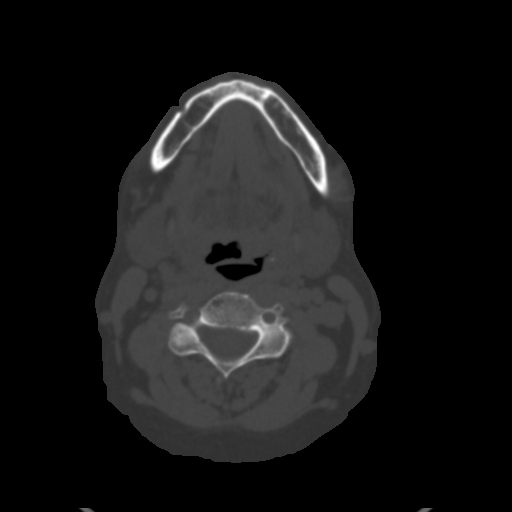
[im 108/135  bone]
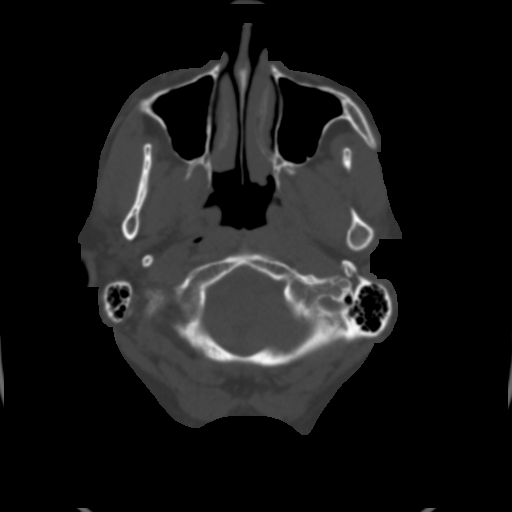

[Series 4: neck 2.0 (person_name)31(person_name) · axial · 0.44mm/px · z∈[-236,-184]mm · 2 of 133 slices shown]
[im 27/133  bone]
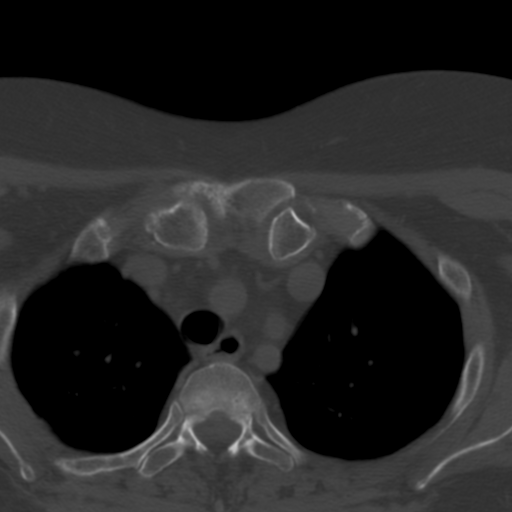
[im 53/133  bone]
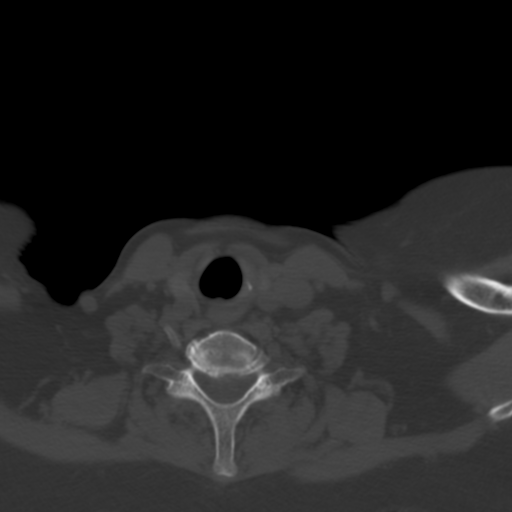

[Series 5: coronal (person_name) · coronal · 0.46mm/px · 3 of 111 slices shown]
[im 23/111  bone]
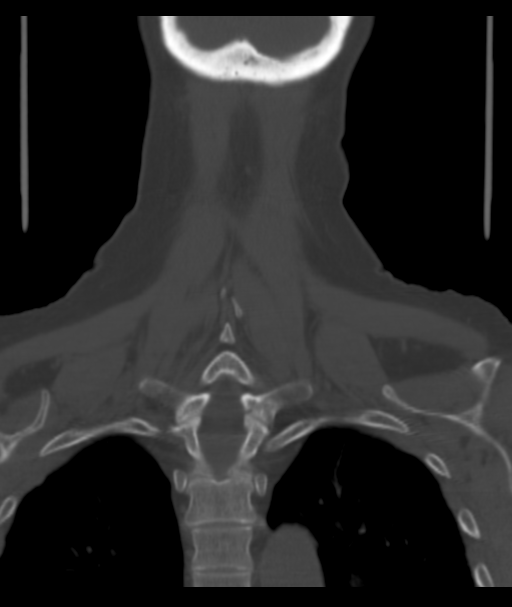
[im 45/111  bone]
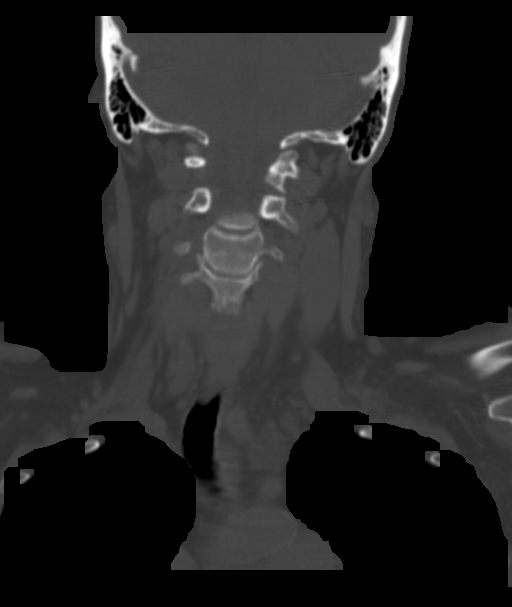
[im 67/111  bone]
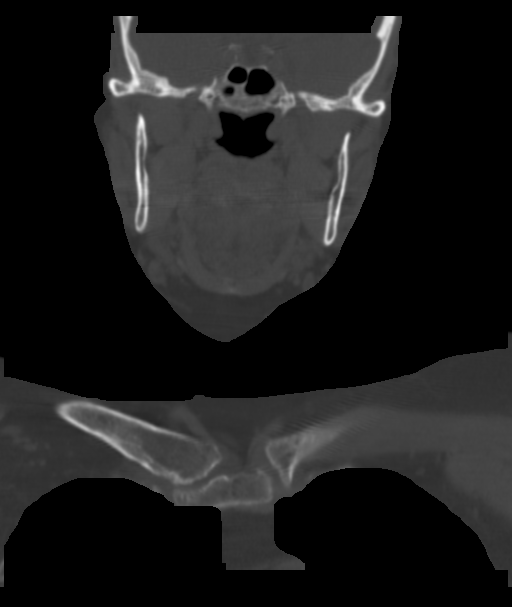

[Series 6: sagittal (person_name) · sagittal · 0.46mm/px · 5 of 114 slices shown, 6 images]
[im 38/114  bone]
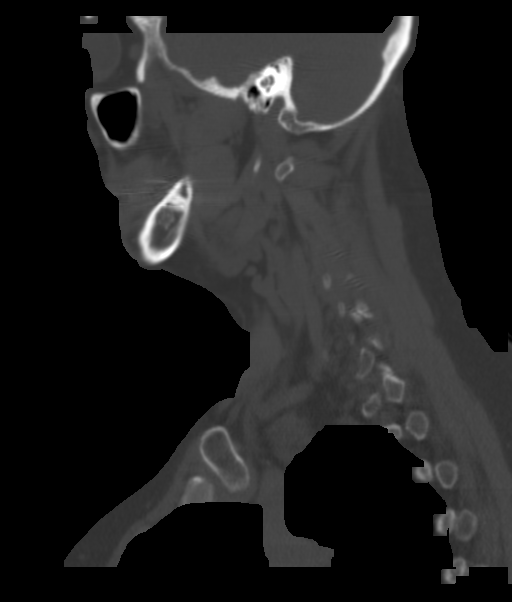
[im 48/114  bone]
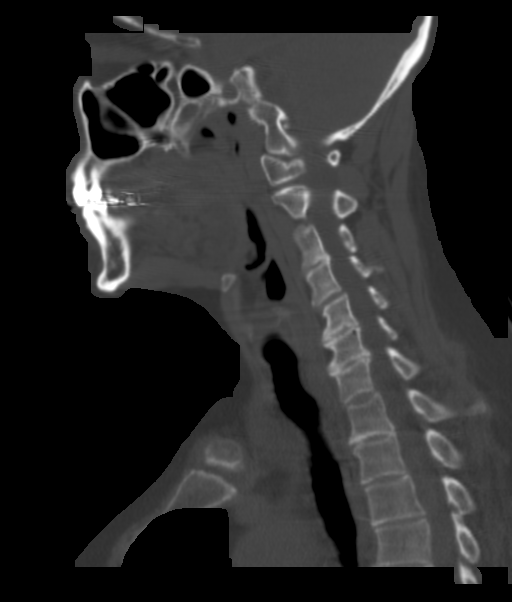
[im 57/114  soft-tissue]
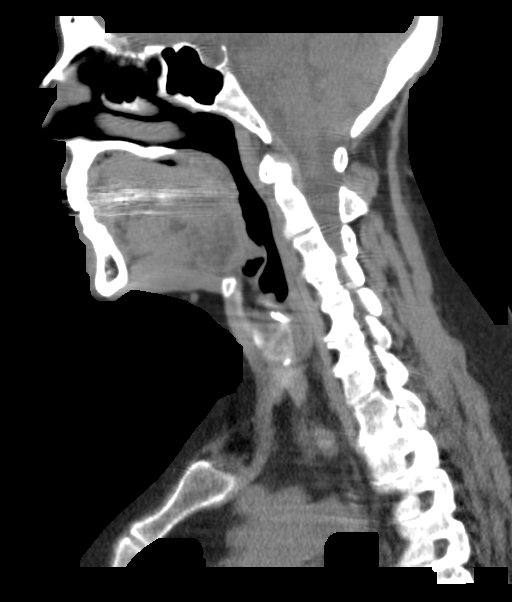
[im 57/114  bone]
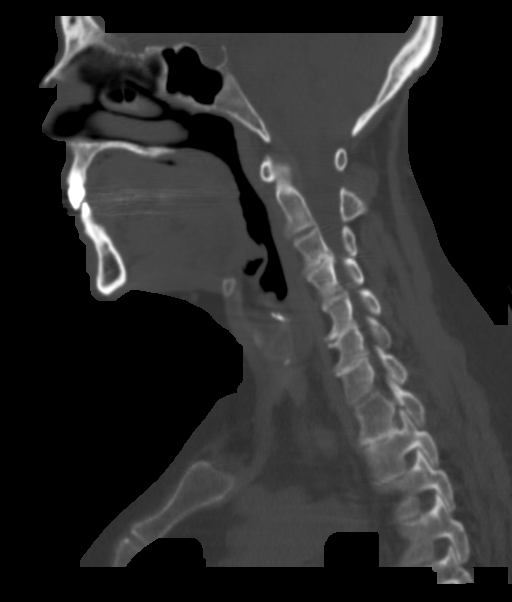
[im 66/114  bone]
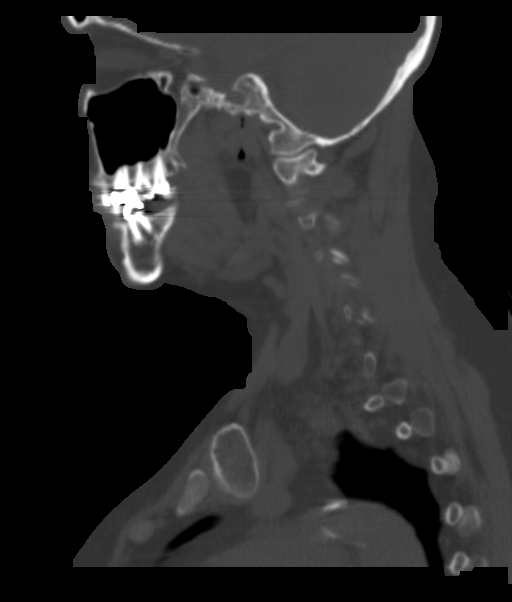
[im 76/114  bone]
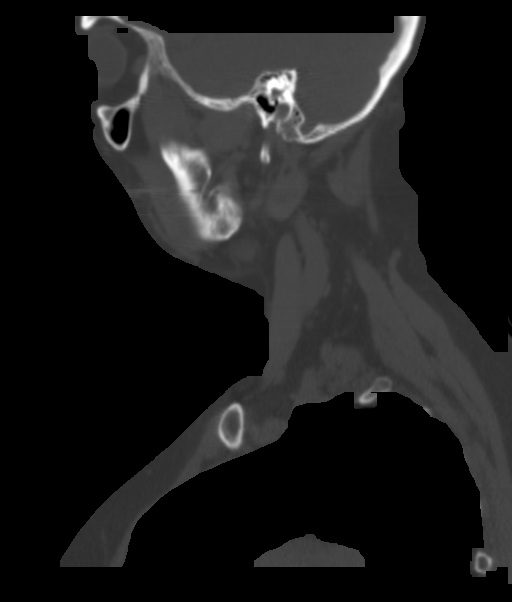

[14 of 33 positions shown; findings below may reference images not displayed]

FINDINGS: Submandibular glands are unremarkable in appearance. The glands are 
symmetric, without evidence for mass or adenitis. There are nonenlarged 
suprahyoid lymph nodes, including in the submental, submandibular and internal 
jugular chains. The largest lymph node adjacent to the right submandibular gland 
measures 8 x 4 mm, most likely benign by size criteria. Several of the larger 
submandibular gland nodes show fatty hila compatible with benignity. No 
lymphadenopathy demonstrated. Previously described subcutaneous induration is no 
longer present. There is no evidence for ranula or other fluid collection. 
Nasopharyngeal contours are preserved. There is a small left tonsillar crypt 
calcification. There is mild lymphoid hyperplasia along the tongue base, 
encroaching on the upper left vallecula, best seen on axial images 55/56. If 
clinically indicated direct visualization would be useful. Otherwise 
supraglottic and glottic contours are preserved. Thyroid lobes are small. No 
upper mediastinal adenopathy. Upper lungs are clear. There is cervical 
spondylosis. There has been bilateral ocular lens implantation. There has been 
left ocular scleral banding.
IMPRESSION: No evidence for submandibular gland mass, adenitis or sialolith. 
Nonenlarged suprahyoid lymph nodes, including several nonenlarged lymph nodes 
adjacent to the right submandibular gland. These are most likely benign, 
reactive lymph nodes by size criteria. 
Lobulated soft tissue extends from the tongue base into the upper left 
vallecula, measuring approximately 10 mm. This likely represents lymphoid 
hyperplasia but early squamous cell cancer cannot be excluded. If indicated 
direct visualization and percutaneous sampling would be useful. 
Cervical spondylosis. 
Status post left scleral banding and ocular lens implantation. No current 
evidence for retinal detachment/hemorrhage. 
RADIATION DOSE REDUCTION: All CT scans are performed using radiation dose 
reduction techniques, when applicable.  Technical factors are evaluated and 
adjusted to ensure appropriate moderation of exposure.  Automated dose 
management technology is applied to adjust the radiation doses to minimize 
exposure while achieving diagnostic quality images.

## 2019-03-18 IMAGING — US LYMPH NODE BIOPSY
1 series · 3 of 3 positions shown · non-contrast
Comparison: CT neck dated 03/16/2019 
PROCEDURE: 
Following discussion of the procedure with patient including the possibilities 
of bleeding, infection, local tissue damage, and failure to acquire a diagnostic 
biopsy and obtaining informed consent, the patient's right neck was prepped and 
draped in usual sterile manner. Access to the most prominent of the right 
cervical lymph nodes was obtained under direct ultrasound visualization with a 
18-gauge core biopsy needle. 3 core biopsy samples were obtained and were sent 
for pathologic assessment. The patient tolerated the procedure well. No comp 
occasions were experienced.

******** ADDENDUM #1 ********/n 
US GUIDED LYMPH NODE BIOPSY, 03/18/2019 [DATE]: 
REPORT ******** 
US GUIDED BIOPSY OF NECK, 03/18/2019 [DATE]: 
CLINICAL INDICATION: Assess cervical lymph nodes in a patient with overlying 
squamous cell carcinoma removed over

[Series 14: small part · 3 of 3 slices shown]
[im 1/3]
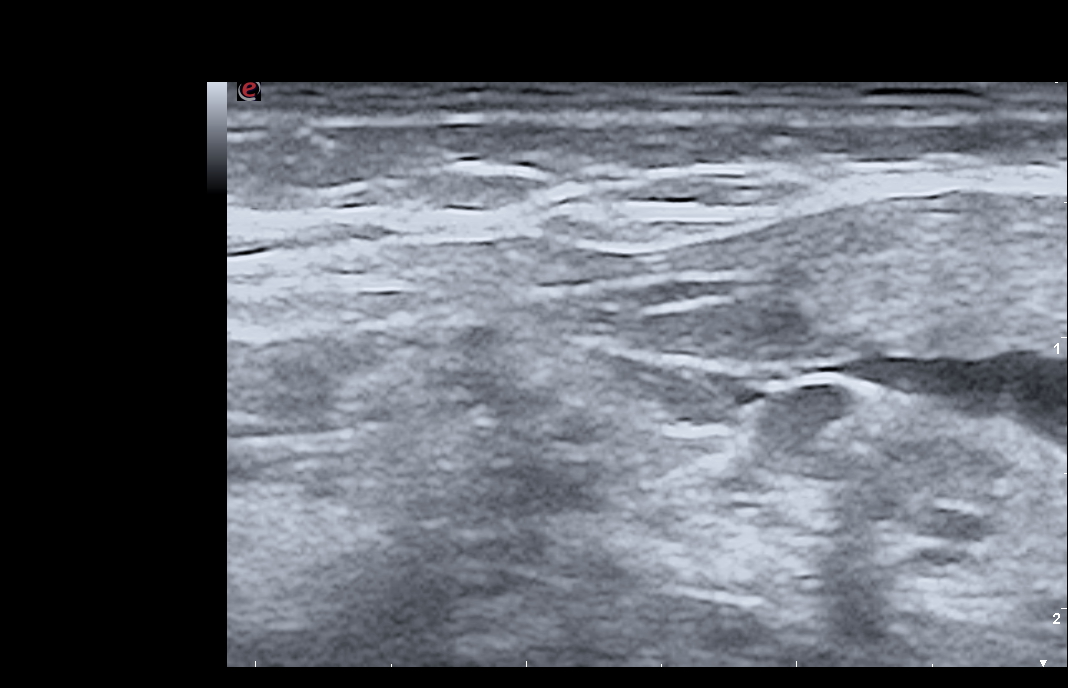
[im 2/3]
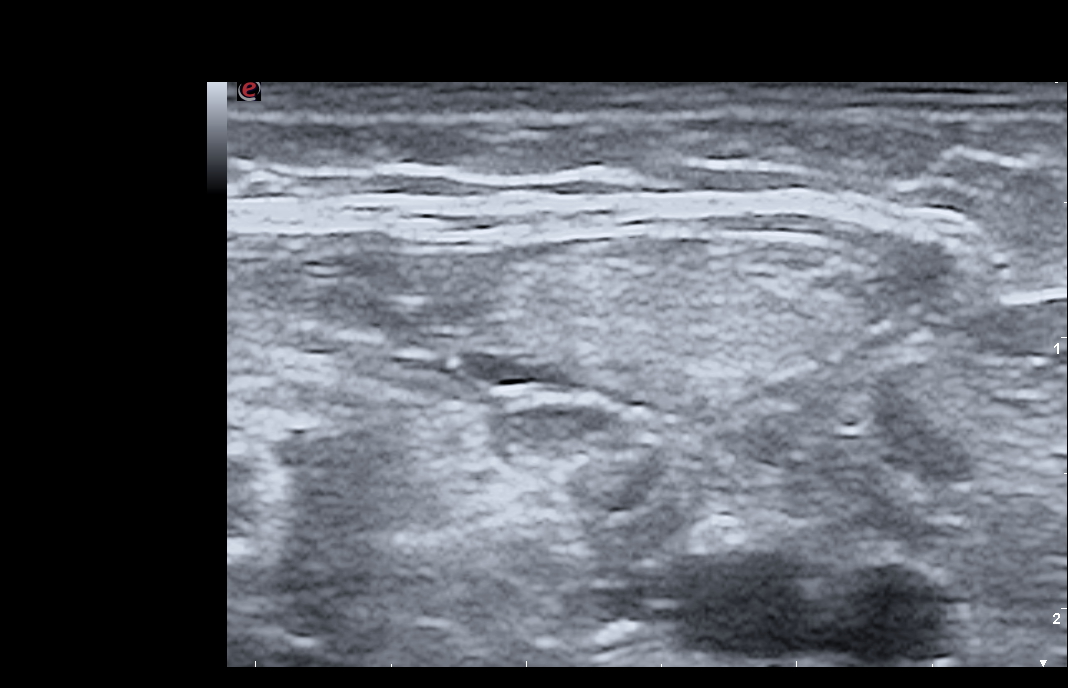
[im 3/3]
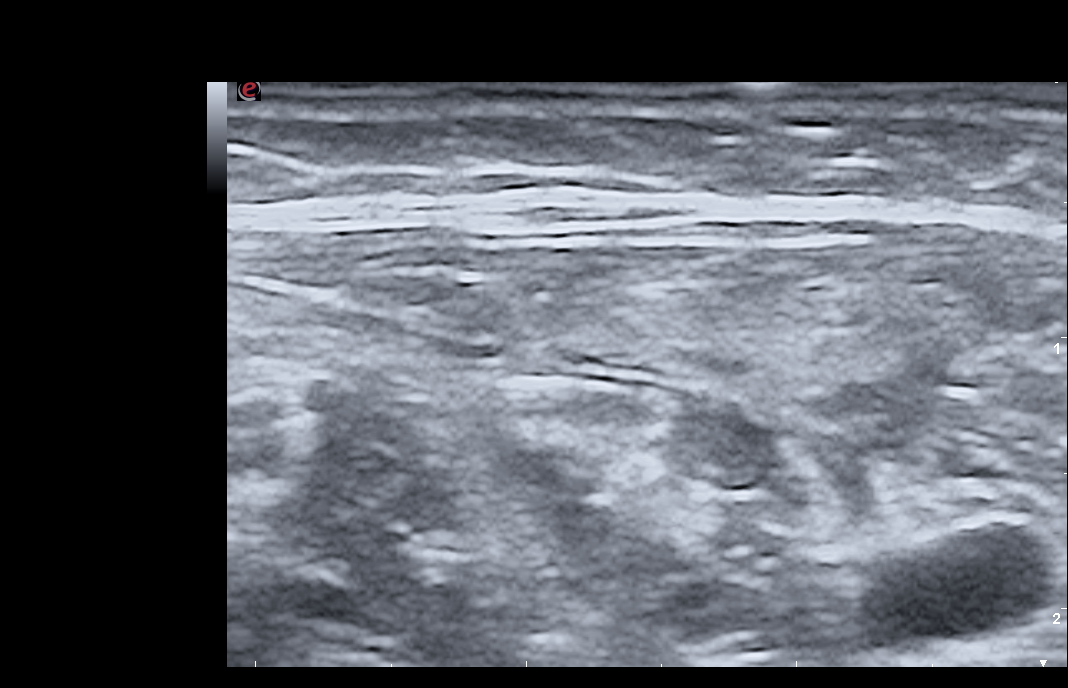

[3 of 3 positions shown; findings below may reference images not displayed]

IMPRESSION: Right cervical ultrasound guided lymph node biopsy performed without 
complications as described above.

## 2019-12-21 MED ORDER — ciprofloxacin HCl (CIPRO) 500 MG tablet
500 | ORAL_TABLET | Freq: Two times a day (BID) | ORAL | 0 refills | Status: AC
Start: 2019-12-21 — End: ?

## 2019-12-21 NOTE — Unmapped (Signed)
Pt called with dysuria  Has not been seen in > 3 years but hx of renal transplant and sx of UTI  No fever, back pain or nausea  Thinks most recent Cr 1.7 which would put GFR around 30  Will send in cirpo 500mg  daily for 5 days  Encouraged to stop by UC lab if possibel and leave culture

## 2020-03-01 IMAGING — CT CT ABDOMEN AND PELVIS WITHOUT CONTRAST
2 of 3 series · 16 of 46 positions shown, 18 images · non-contrast
Comparison: None

CT ABDOMEN AND PELVIS WITHOUT CONTRAST, 03/01/2020 [DATE]: 
CLINICAL INDICATION:  Right lower quadrant pain for 2 months 
A search for DICOM formatted images was conducted for prior CT imaging studies 
completed at a non-affiliated media free facility.
TECHNIQUE: The abdomen and pelvis were scanned from lung bases through the 
pubic rami without contrast on a high-resolution CT scanner using dose reduction 
techniques. Routine MPR reconstructions were performed.

[Series 2: abd/pel wo · axial · 0.89mm/px · z∈[-530,-83]mm · 13 of 171 slices shown, 15 images]
[im 11/171  soft-tissue]
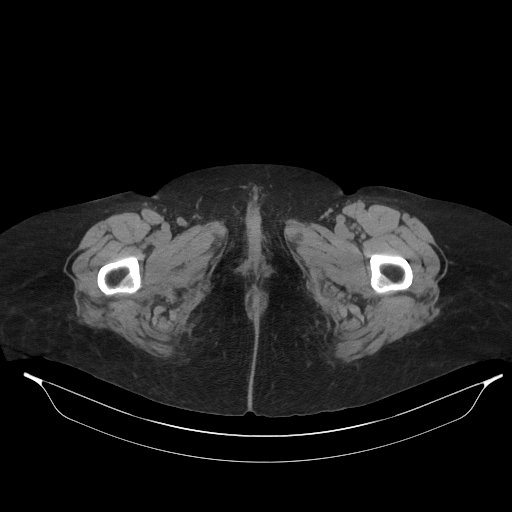
[im 11/171  bone]
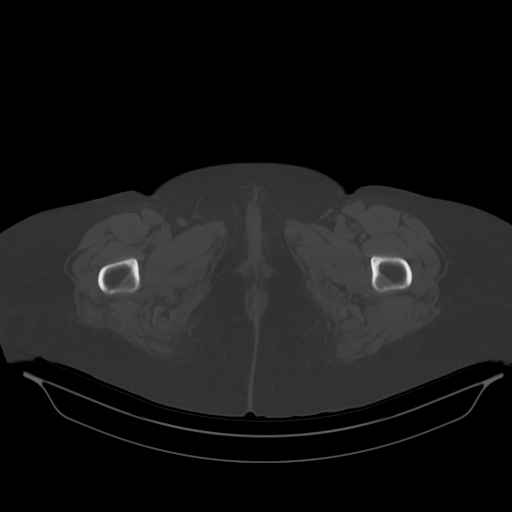
[im 22/171  soft-tissue]
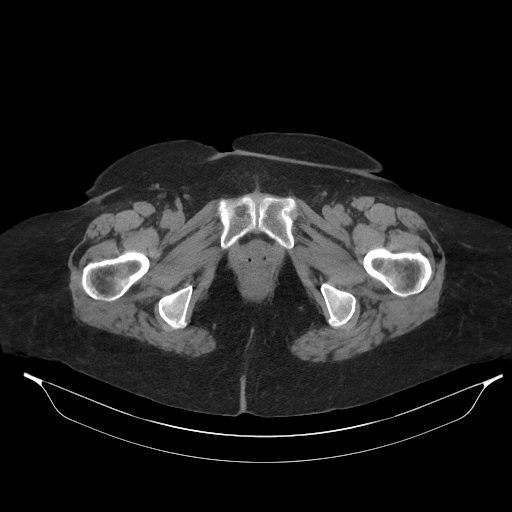
[im 33/171  soft-tissue]
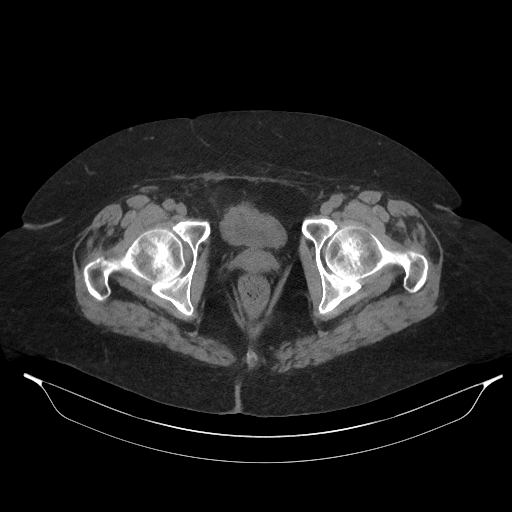
[im 50/171  soft-tissue]
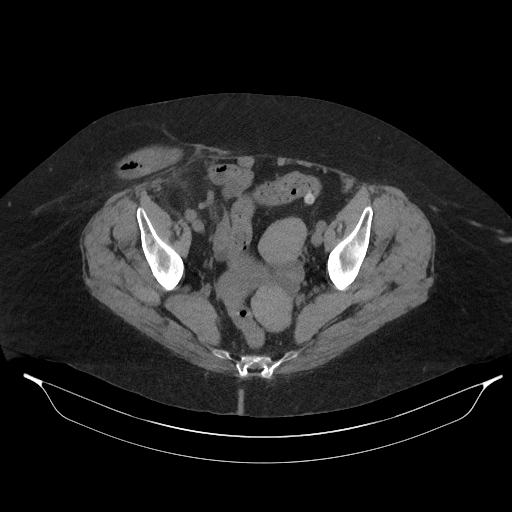
[im 61/171  soft-tissue]
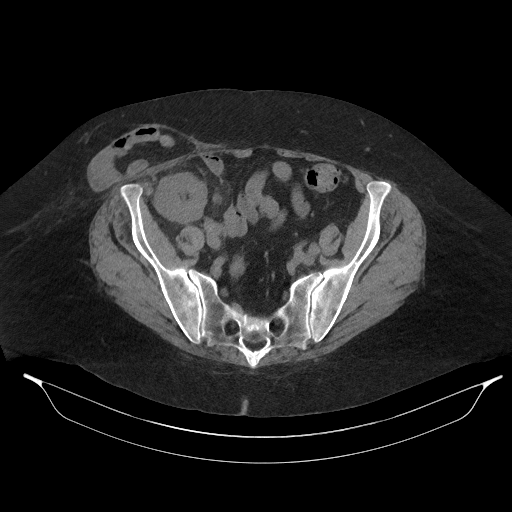
[im 72/171  soft-tissue]
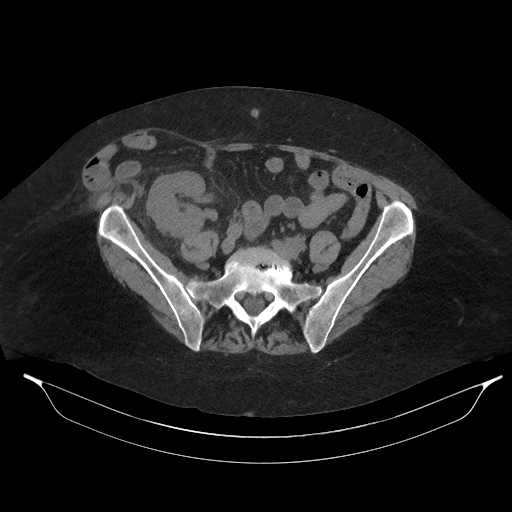
[im 88/171  soft-tissue]
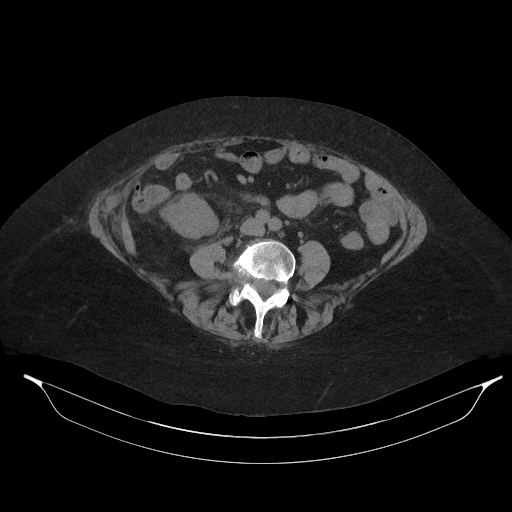
[im 99/171  soft-tissue]
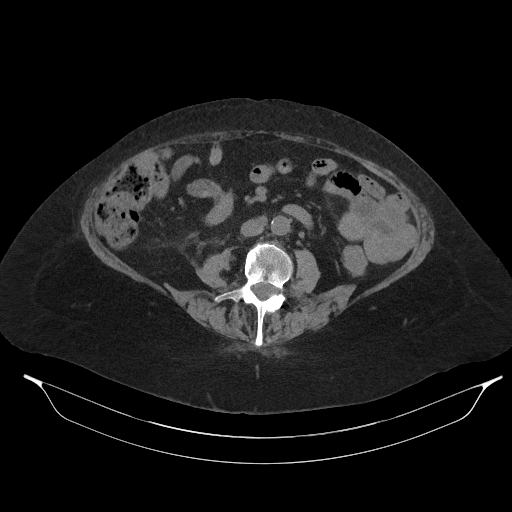
[im 110/171  soft-tissue]
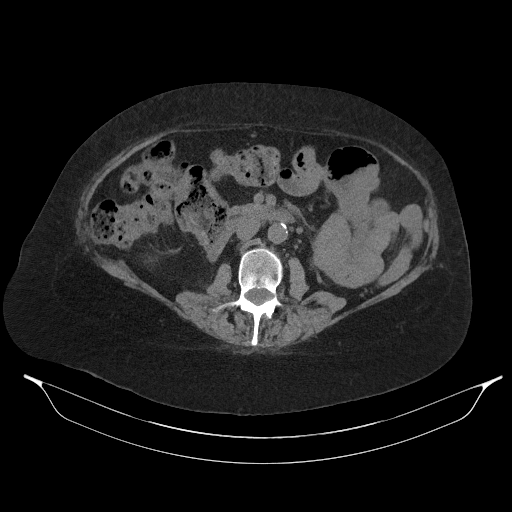
[im 110/171  bone]
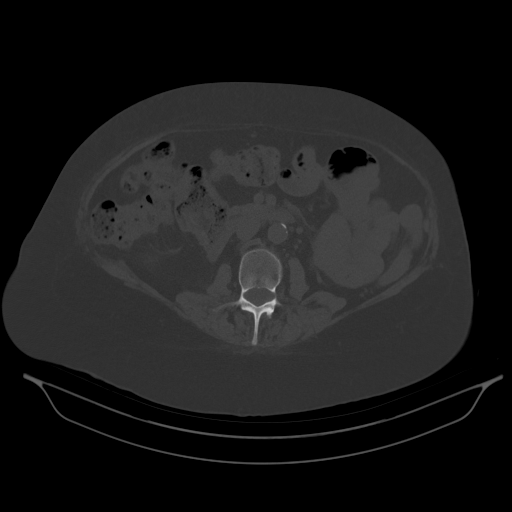
[im 121/171  soft-tissue]
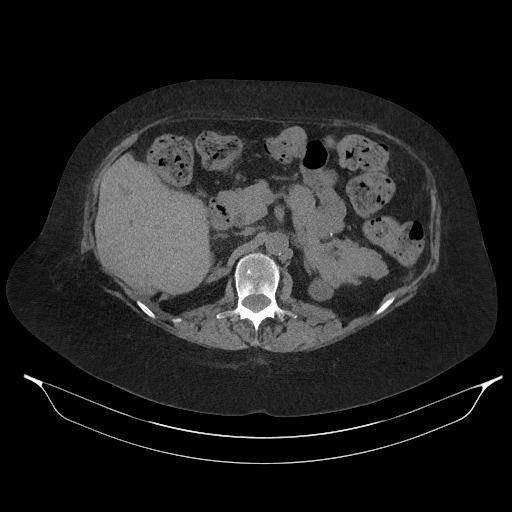
[im 138/171  soft-tissue]
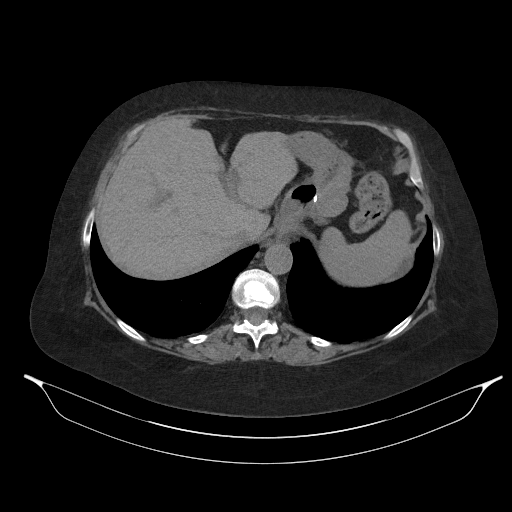
[im 149/171  soft-tissue]
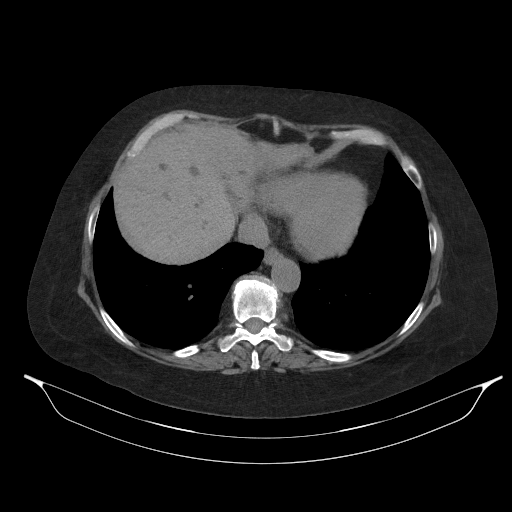
[im 160/171  soft-tissue]
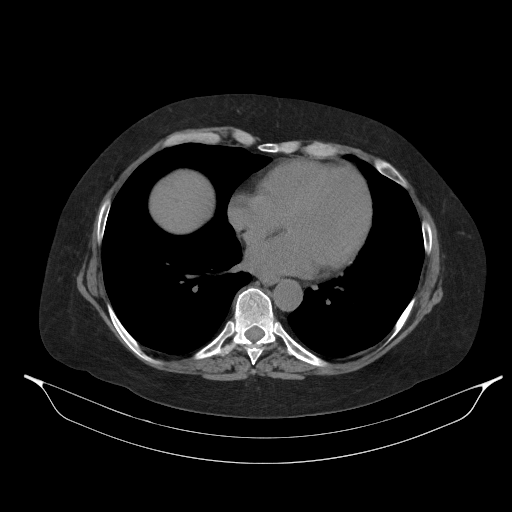

[Series 3: abd/pel cor · coronal · 0.85mm/px · 3 of 139 slices shown]
[im 47/139  soft-tissue]
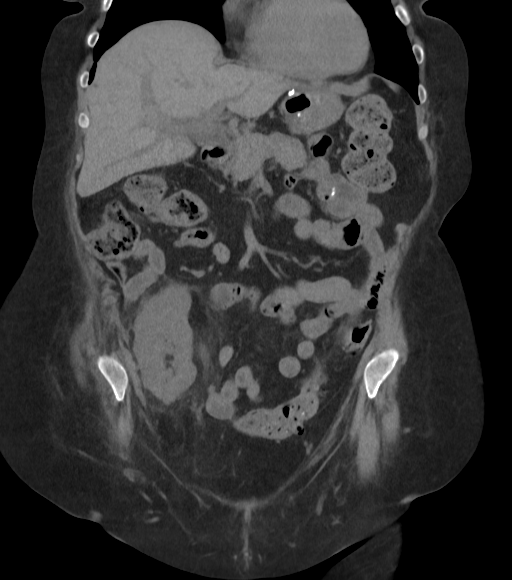
[im 62/139  soft-tissue]
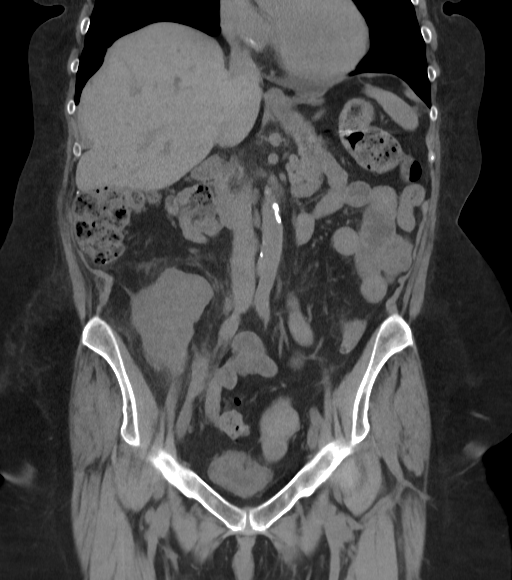
[im 77/139  soft-tissue]
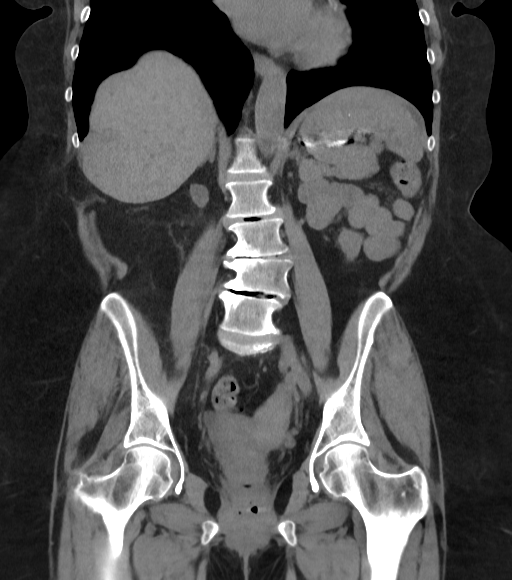

[16 of 46 positions shown; findings below may reference images not displayed]

FINDINGS: LUNG BASES: RML calcified granuloma. Lungs are otherwise clear. Mild elevation 
of the right hemidiaphragm. No pleural effusions. 
HEPATOBILIARY: Multiple tiny hepatic cysts. Non contrast images show no mass or 
biliary dilatation. Cholecystectomy. 
SPLEEN: Normal in size. Calcified granulomata. 
PANCREAS: No evidence for ductal dilatation or mass. 
ADRENALS: No mass. 
GENITOURINARY: Bilateral renal resection. Right pelvic kidney with mild 
surrounding fat stranding. No perinephric fluid collections. No hydronephrosis 
or kidney stones. Bladder is unremarkable. 
LYMPH NODES: No adenopathy. Right hilar calcified lymph nodes. 
STOMACH, SMALL BOWEL AND COLON: Postsurgical change of the stomach and proximal 
small bowel. Colonic diverticulosis without evidence of diverticulitis. No bowel 
wall thickening or obstruction. 
VASCULAR STRUCTURES: No aneurysm. 
MUSCULOSKELETAL: Nonobstructed small bowel extends into the right anterior 
pelvic wall contained hernia. No acute osseous abnormality. Scattered 
degenerative changes. 
ADDITIONAL FINDINGS: Fibroid uterus, with the largest fibroid measuring 5.4 cm.
IMPRESSION: 1.  Bilateral renal resection. Right pelvic kidney with mild surrounding fat 
stranding and pelvic wall hernia. 
2.  Colonic diverticulosis without evidence of diverticulitis. 
3.  Post surgical change of the stomach/proximal small bowel and 
cholecystectomy. 
4.  Fibroid uterus. 
5.  Prior granulomatous disease. 
6.  Atherosclerosis. 
7.  Degenerative change. 
RADIATION DOSE REDUCTION: All CT scans are performed using radiation dose 
reduction techniques, when applicable.  Technical factors are evaluated and 
adjusted to ensure appropriate moderation of exposure.  Automated dose 
management technology is applied to adjust the radiation doses to minimize 
exposure while achieving diagnostic quality images.

## 2021-05-29 IMAGING — DX HAND 3 VIEWS RIGHT
1 series · 3 of 3 positions shown · non-contrast
Comparison: None.

________________________________________________________________________________________________ 
HAND 3 VIEWS RIGHT, 05/29/2021 [DATE]: 
CLINICAL INDICATION: Hand pain. Rule out gout.

[Series 1: PA · U · 0.14mm/px · 3 of 3 slices shown]
[im 1/3]
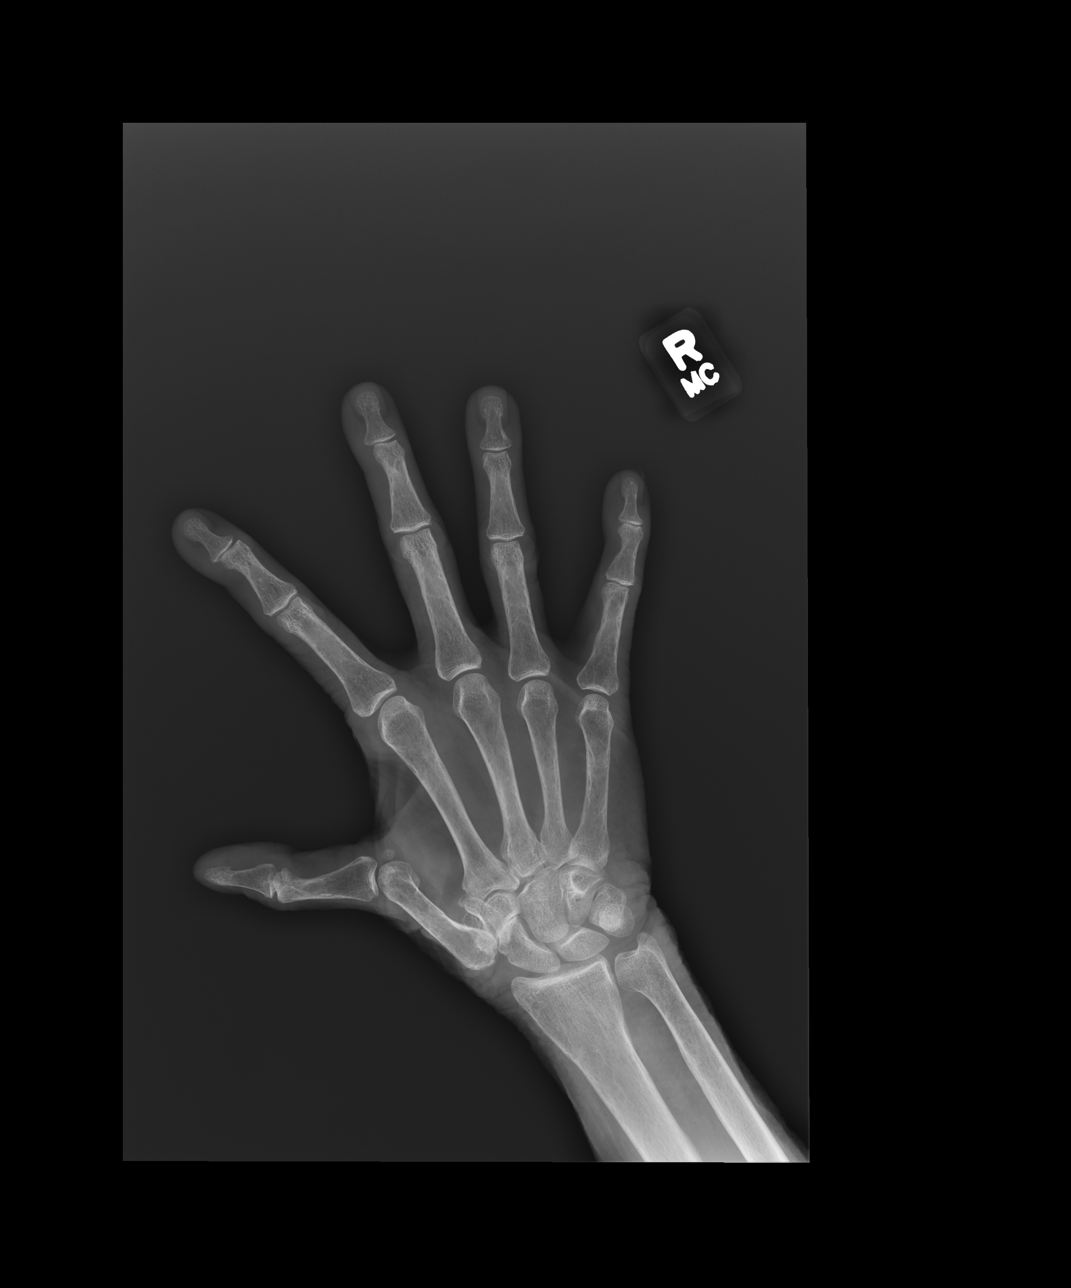
[im 2/3]
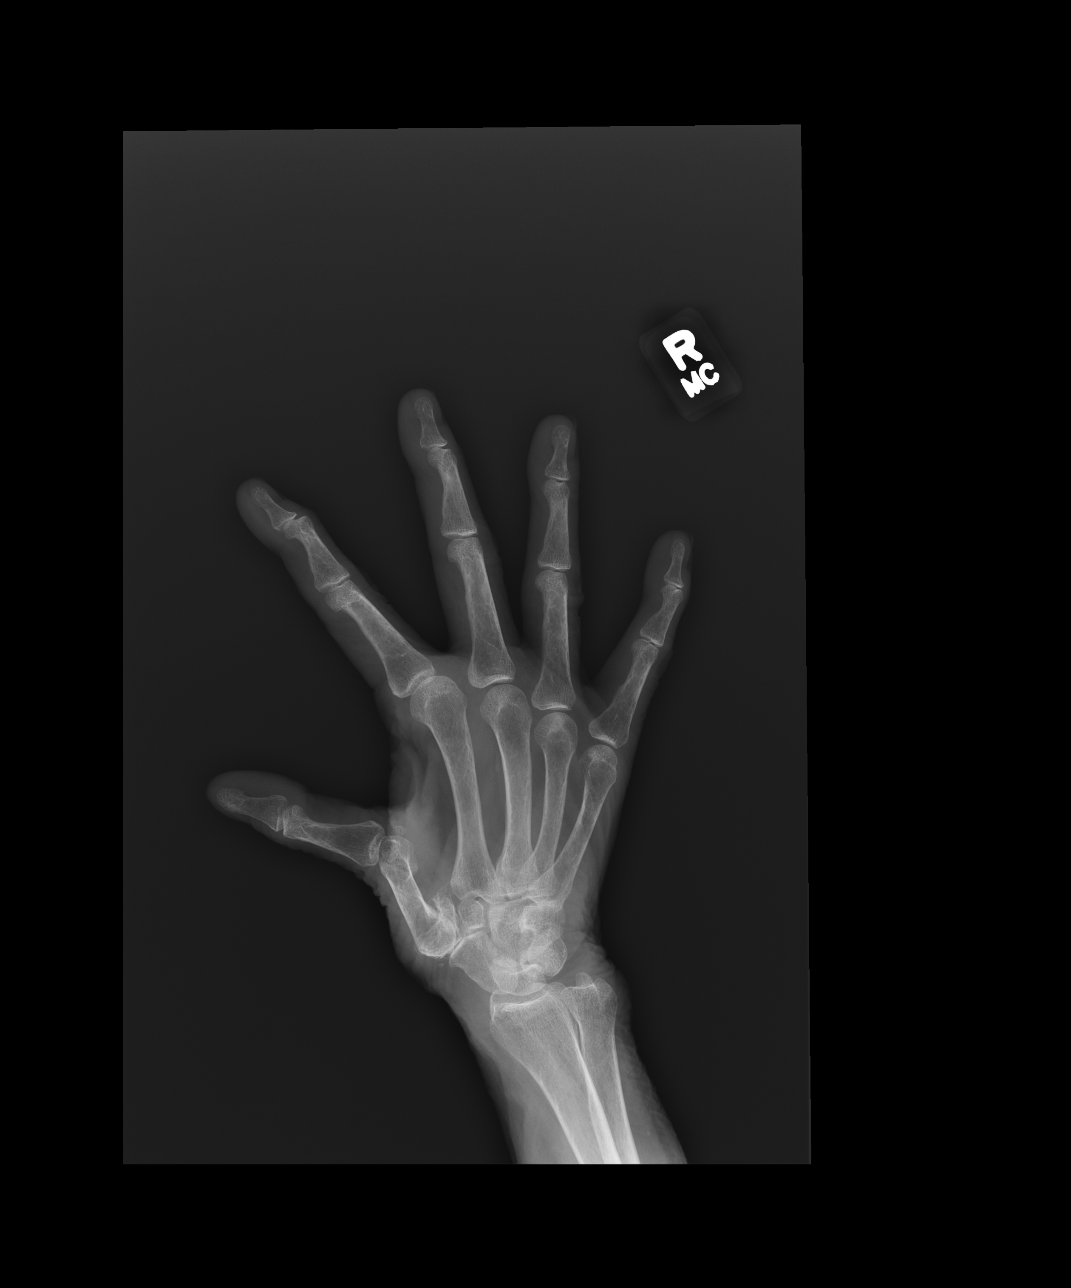
[im 3/3]
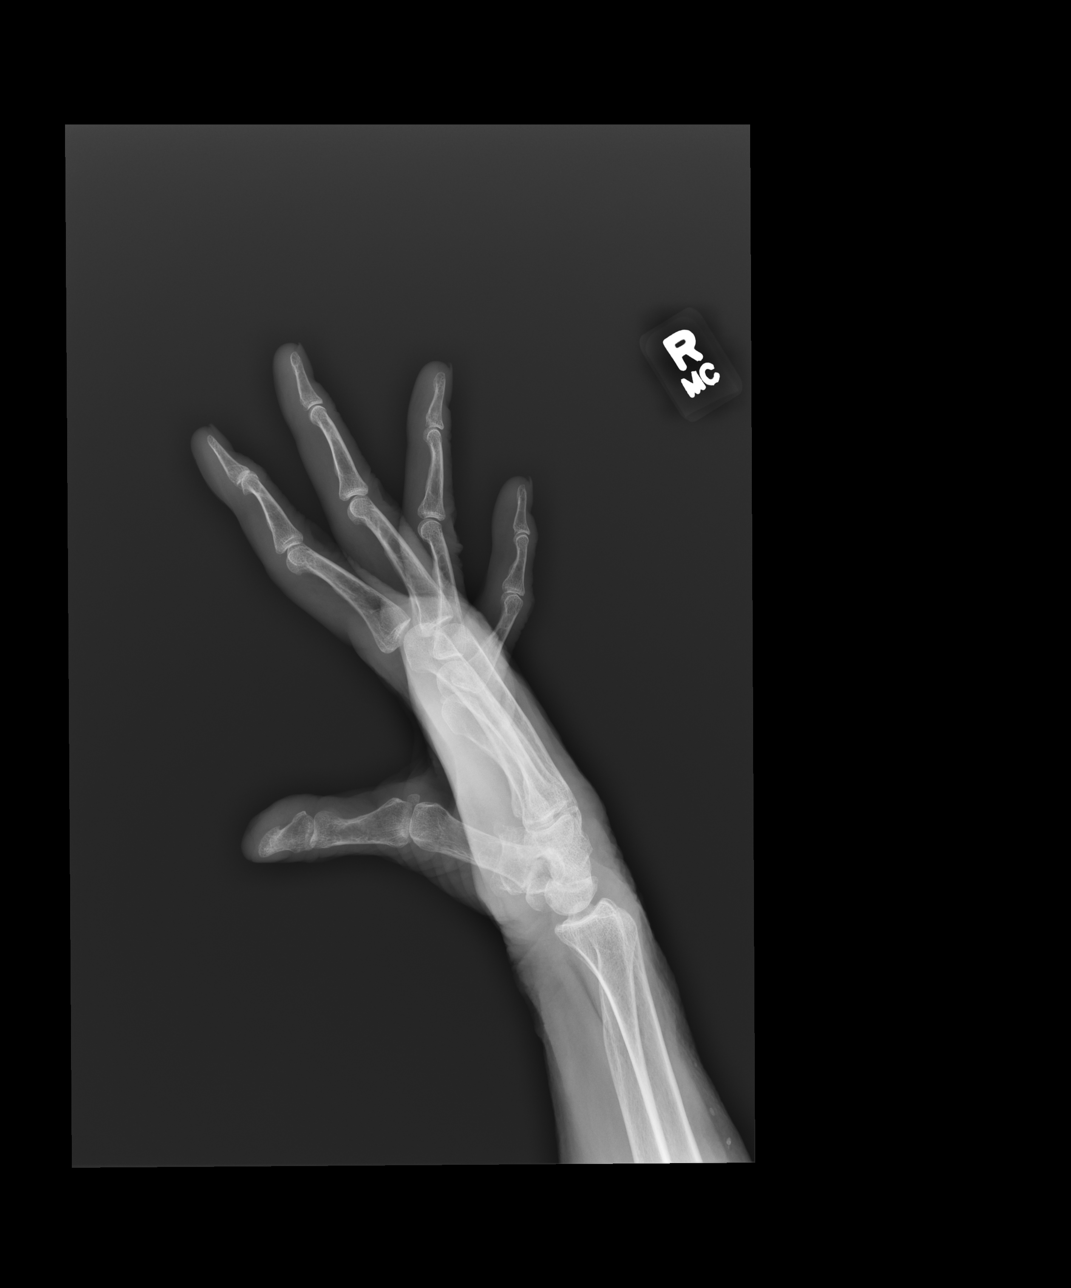

[3 of 3 positions shown; findings below may reference images not displayed]

FINDINGS: No specific findings for gout. Marked degenerative change of the thumb 
carpometacarpal (DQD-N) joint with marked remodeling/subluxation of the 
trapezium and proximal migration of the thumb metacarpal (which abuts the distal 
scaphoid). Marked degenerative change of the scaphotrapeziotrapezoid (STT) 
complex. Mild degenerative change of multiple interphalangeal (IP) joints. 
Osteopenia. No fractures. No soft tissue swelling.
IMPRESSION: 1.  No specific findings for gout.  
2.  Multifocal degenerative change and osteopenia: DXA may be helpful for 
further evaluation.

## 2022-05-28 IMAGING — MG MAMMOGRAPHY SCREENING BILATERAL 3[PERSON_NAME]
6 of 10 series · 6 of 30 positions shown · non-contrast
Comparison: Comparison was made to prior examinations.

________________________________________________________________________________________________ 
MAMMOGRAPHY SCREENING BILATERAL 3ISRRAEL FELIZ, 05/28/2022 [DATE]: 
CLINICAL INDICATION: Encounter for screening mammogram.
TECHNIQUE: Digital bilateral mammograms and 3-D Tomosynthesis were obtained. 
These were interpreted both primarily and with the aid of computer-aided 
detection system.  
BREAST DENSITY: (Level B) There are scattered areas of fibroglandular density.

[L CC]
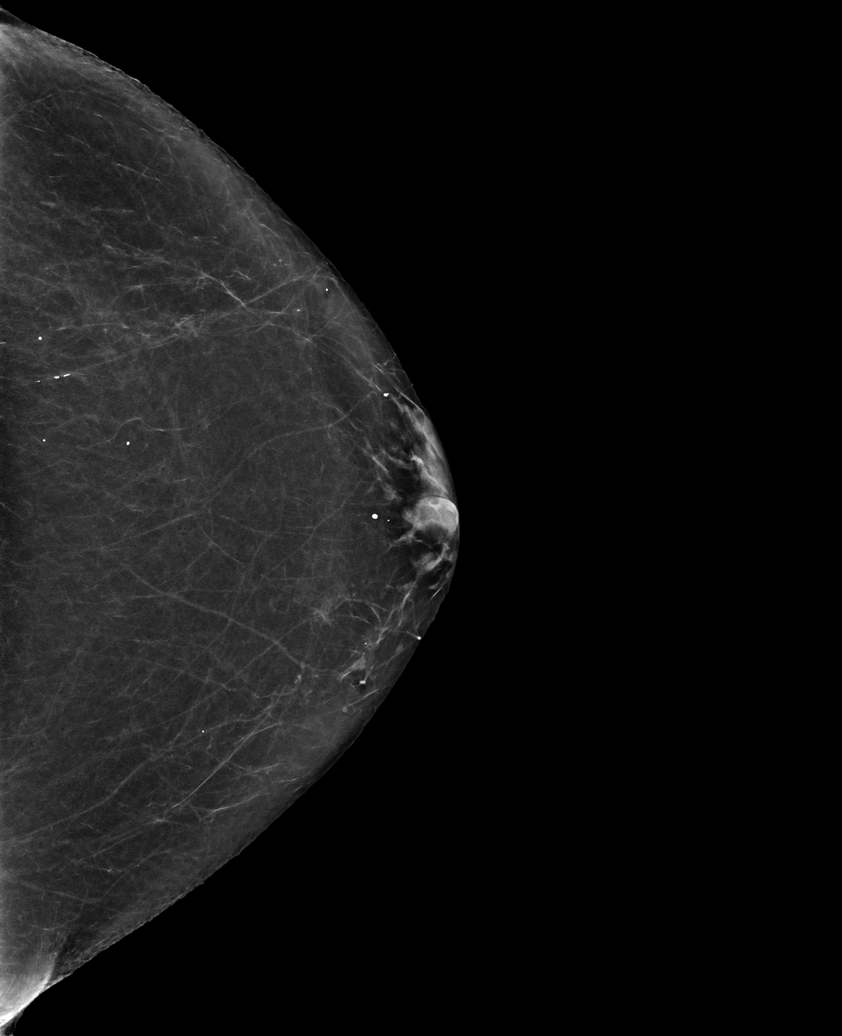

[R MLO]
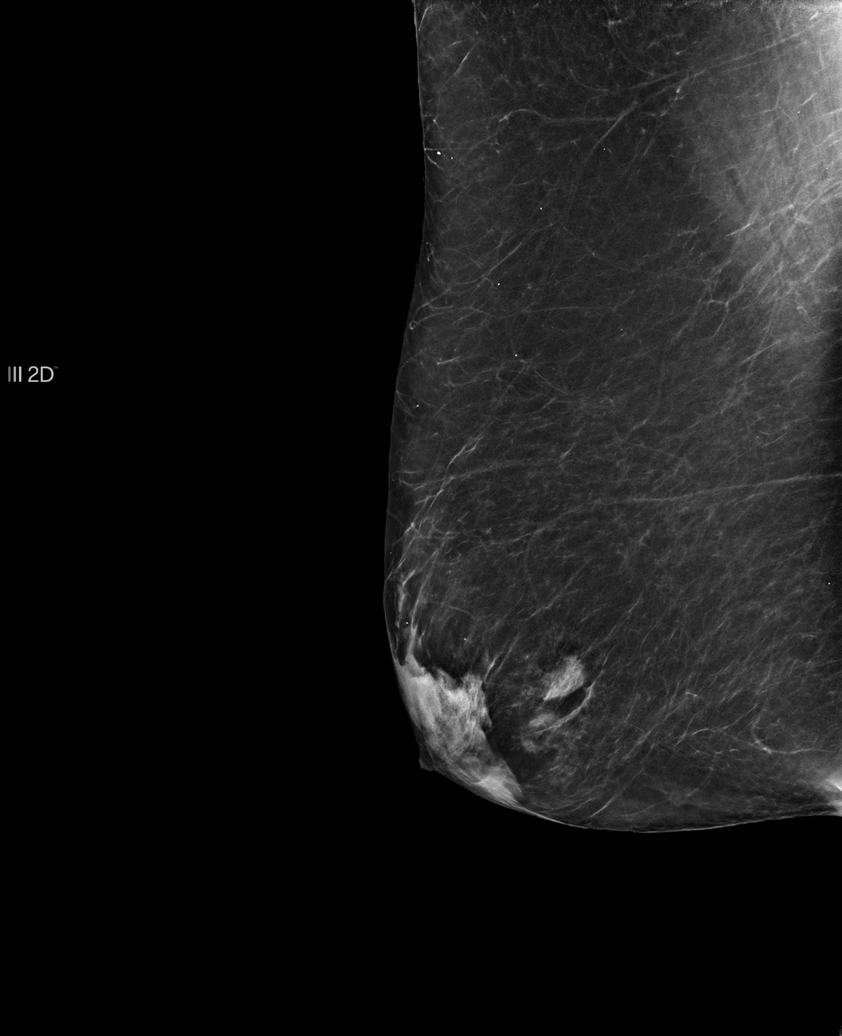

[L MLO]
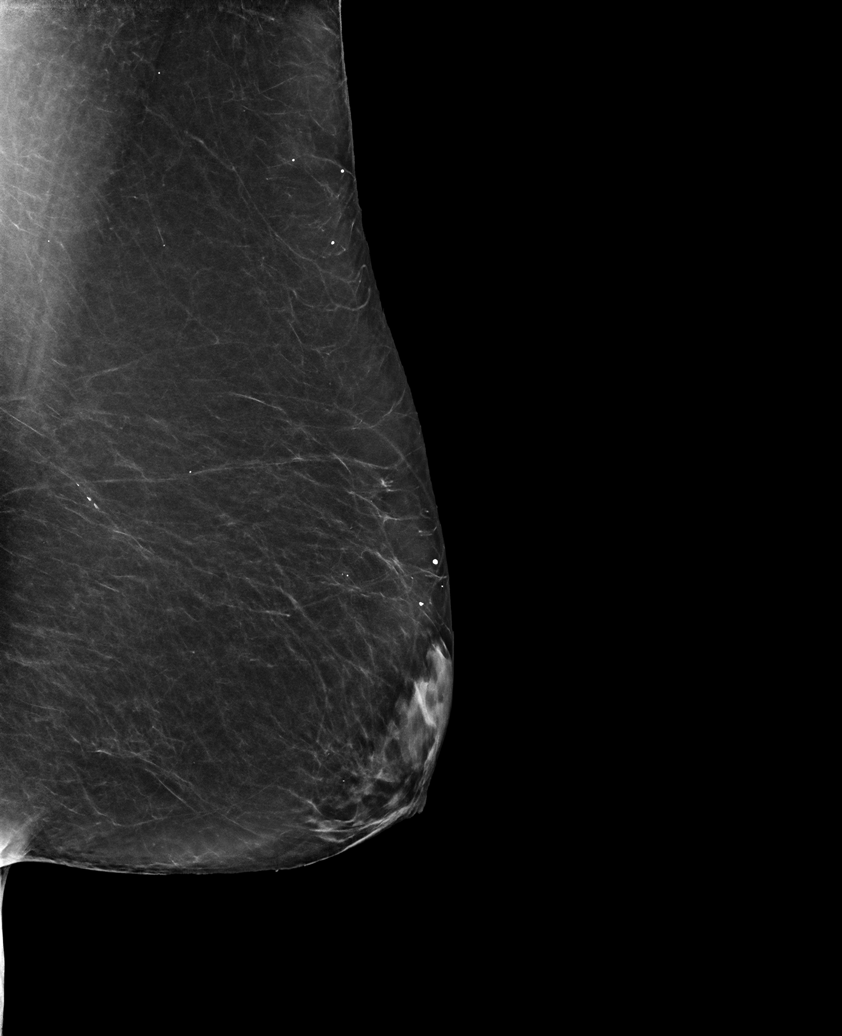

[R CC]
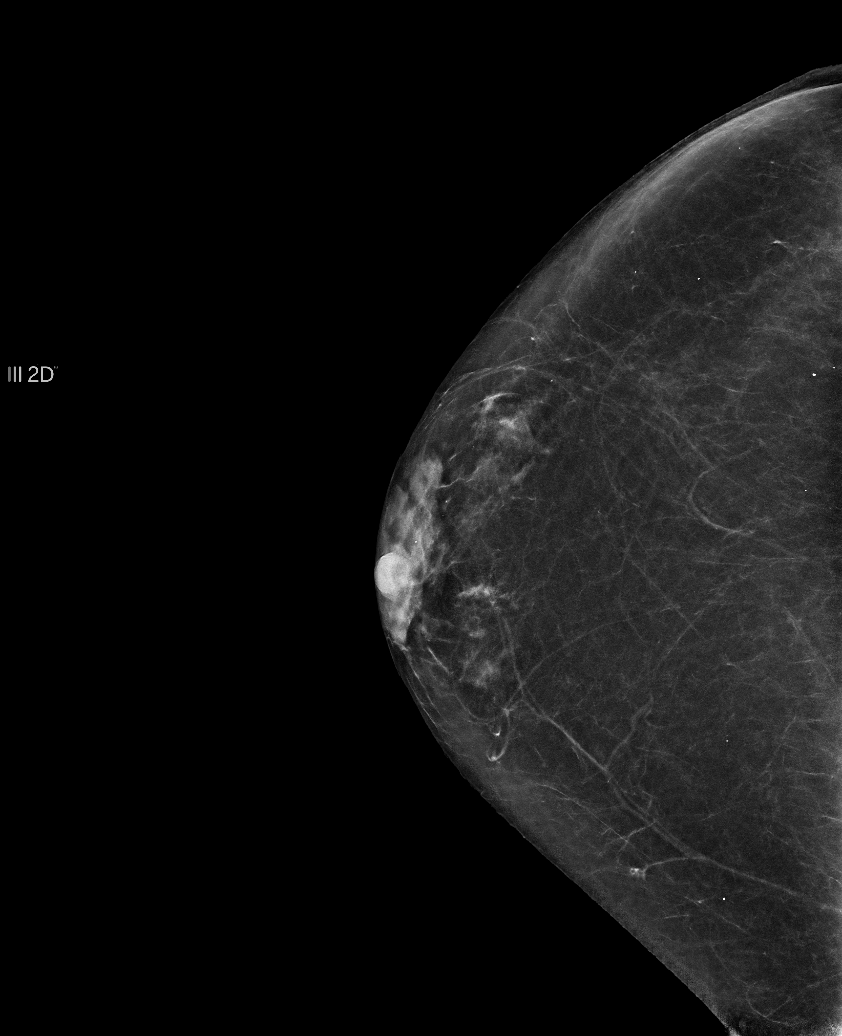

[L CV]
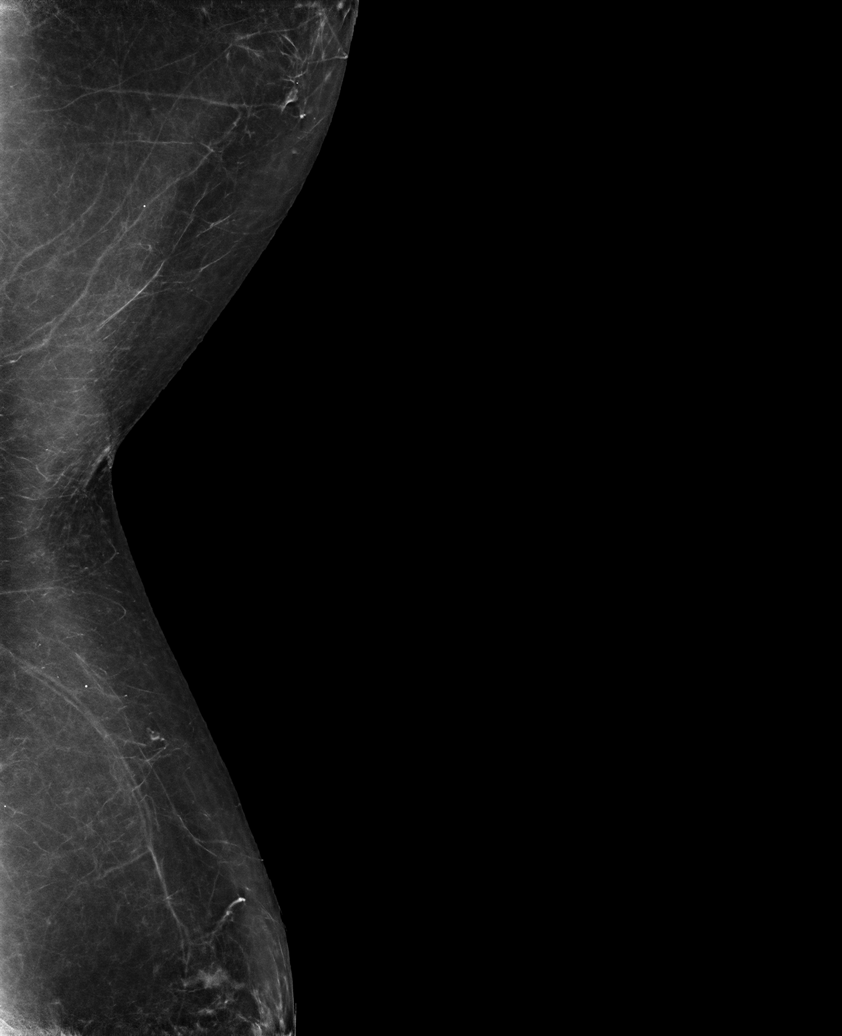

[L CV tomo · tomo slice 34/67.0]
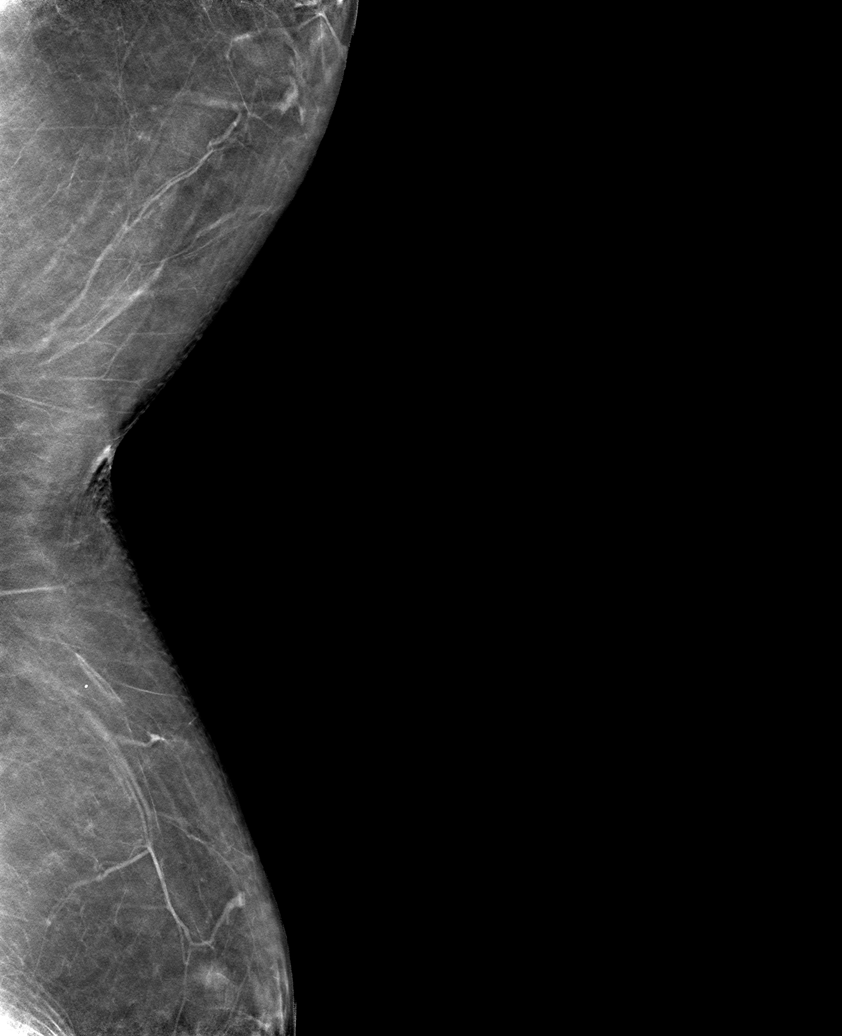

[6 of 30 positions shown; findings below may reference images not displayed]

FINDINGS: Benign calcification. No suspicious mass, calcifications, or area of 
architectural distortion in either breast.
IMPRESSION: No dominant mass or suspicious calcified embolization. 
(BI-RADS 2) Benign findings. Routine mammographic follow-up is recommended.

## 2022-09-17 IMAGING — DX WRIST LEFT 3 VIEWS
1 series · 3 of 3 positions shown · non-contrast
Comparison: None.

________________________________________________________________________________________________ 
WRIST RIGHT 3 VIEWS, WRIST LEFT 3 VIEWS, HAND 3 VIEWS RIGHT, HAND 3 VIEWS LEFT, 
09/17/2022 [DATE]: 
CLINICAL INDICATION: Inflammatory Polyarthropathy.

[Series 1: PA · U · 0.14mm/px · 3 of 3 slices shown]
[im 1/3]
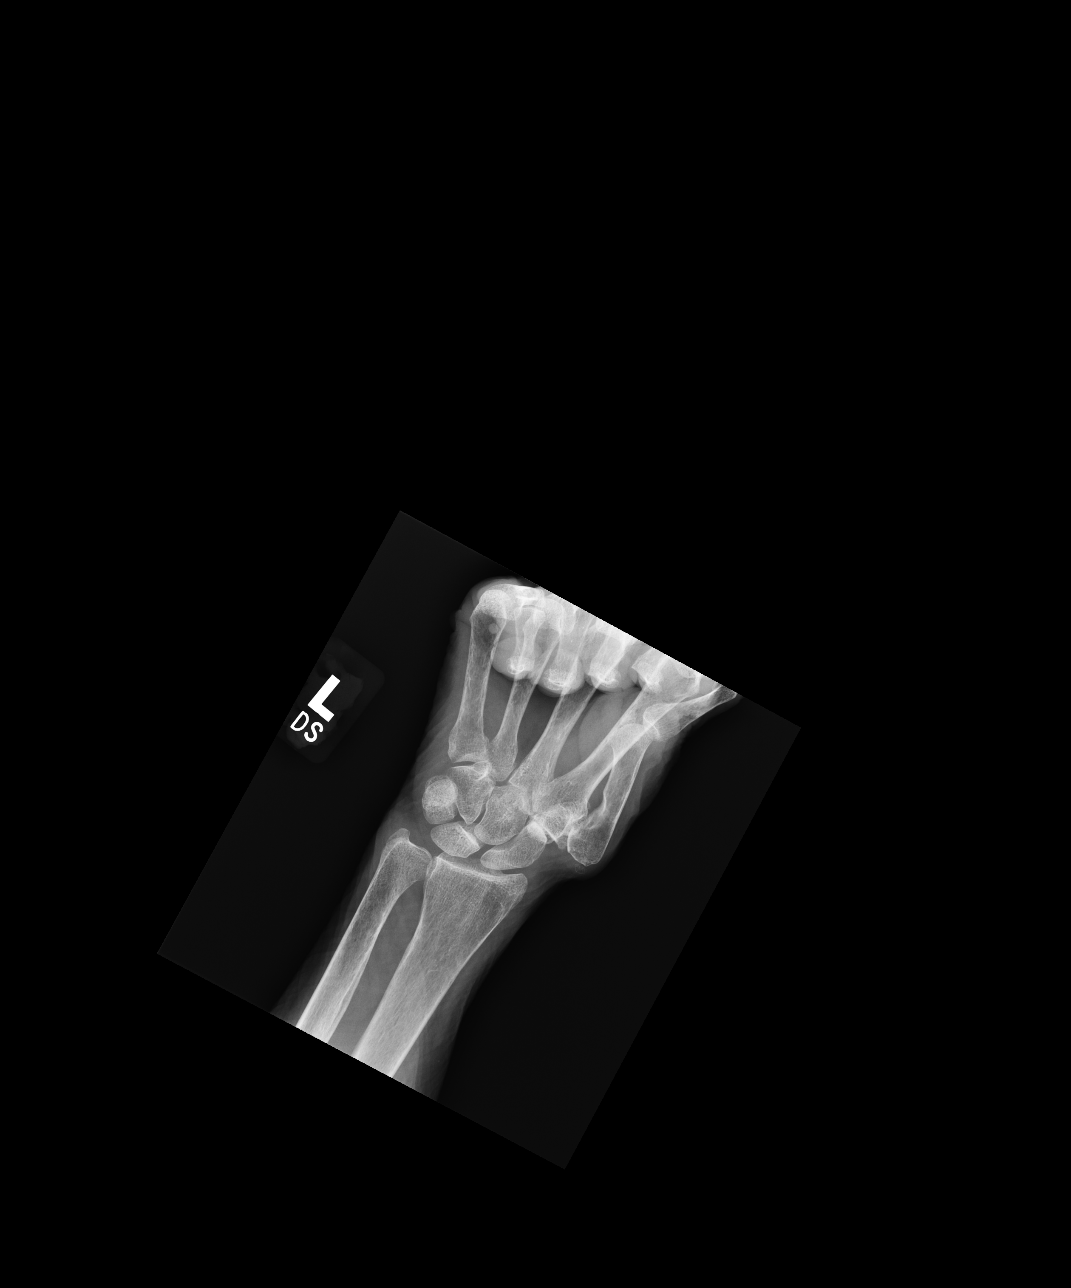
[im 2/3]
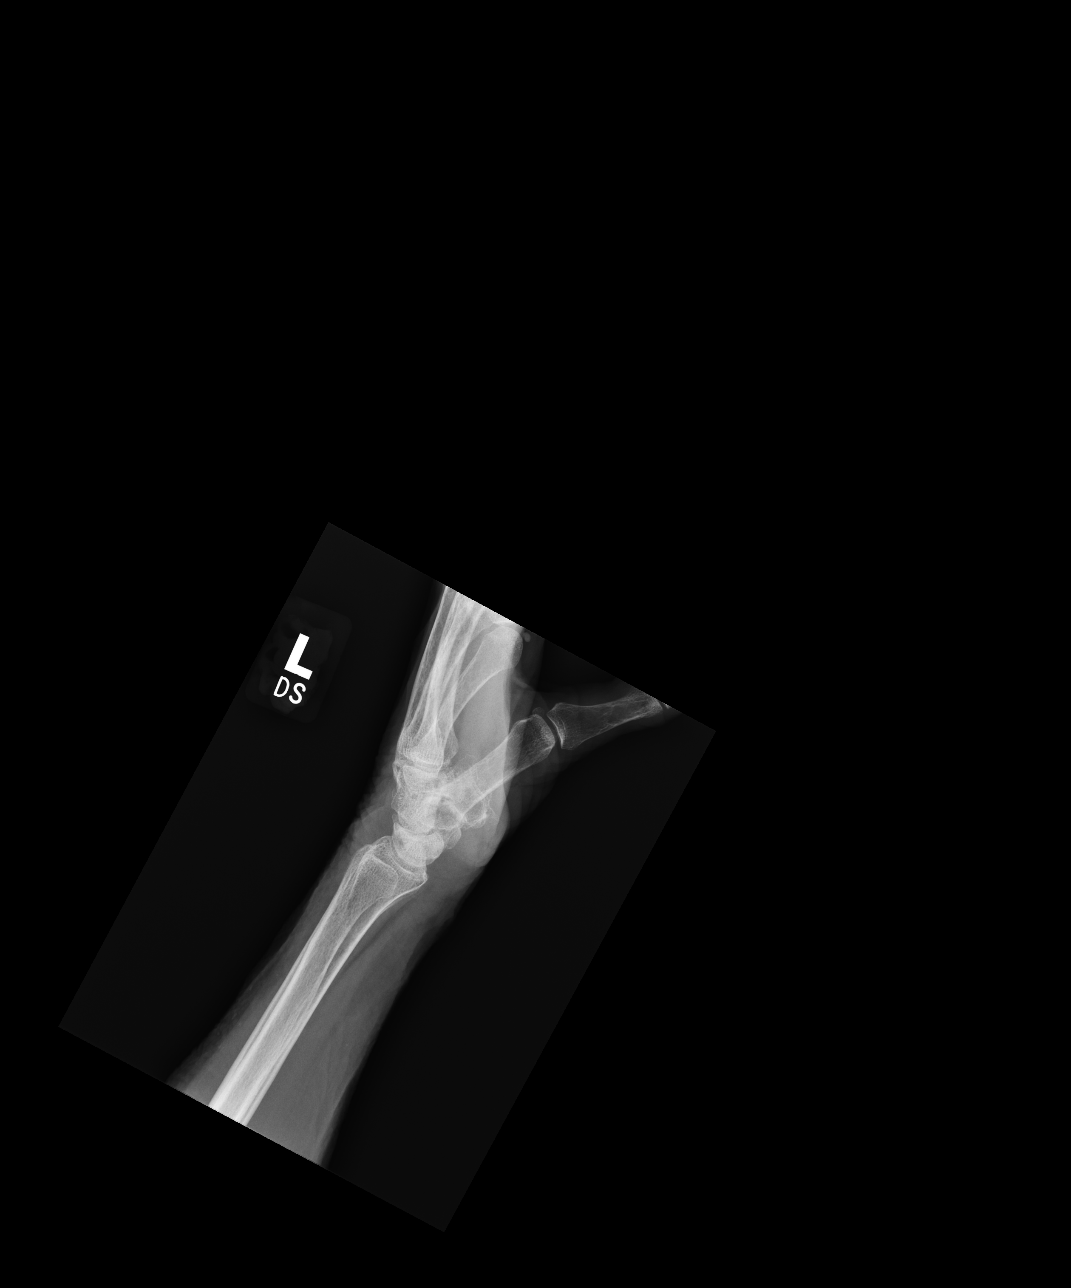
[im 3/3]
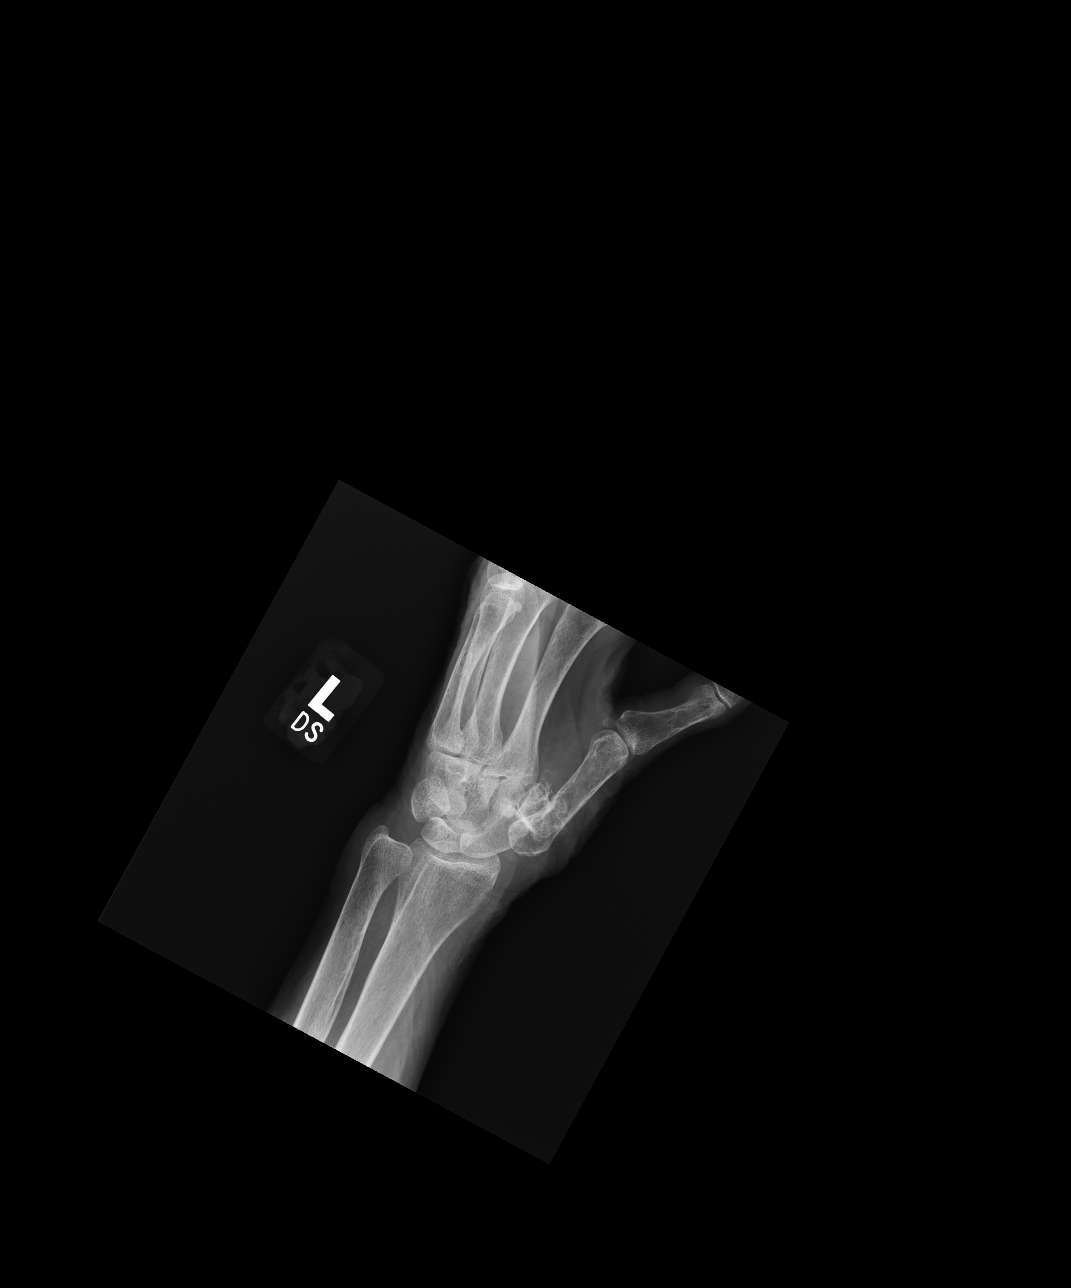

[3 of 3 positions shown; findings below may reference images not displayed]

FINDINGS: No erosions. Dislocations of the thumb carpometacarpal (K4K-L) joints 
with proximal and lateral subluxation of the thumb metacarpals. Extension of the 
thumbs at the metacarpophalangeal (MCP) joints. Marked degeneration of the 
scaphotrapeziotrapezoid (STT) complexes. Degenerative change of several 
interphalangeal (IP) joints. No fracture. Osteoporosis. No focal soft tissue 
swelling.
IMPRESSION: 1.  No erosions.  
2.  Dislocations of the thumbs, multifocal degenerative change and osteoporosis.

## 2022-09-17 IMAGING — DX SHOULDER LEFT 3 VIEW
1 series · 3 of 3 positions shown · non-contrast
Comparison: None.

________________________________________________________________________________________________ 
SHOULDER LEFT 3 VIEW, SHOULDER RIGHT 3 VIEW, 09/17/2022 [DATE]: 
CLINICAL INDICATION: Shoulder pain.

[Series 1: AP · U · 0.14mm/px · 3 of 3 slices shown]
[im 1/3]
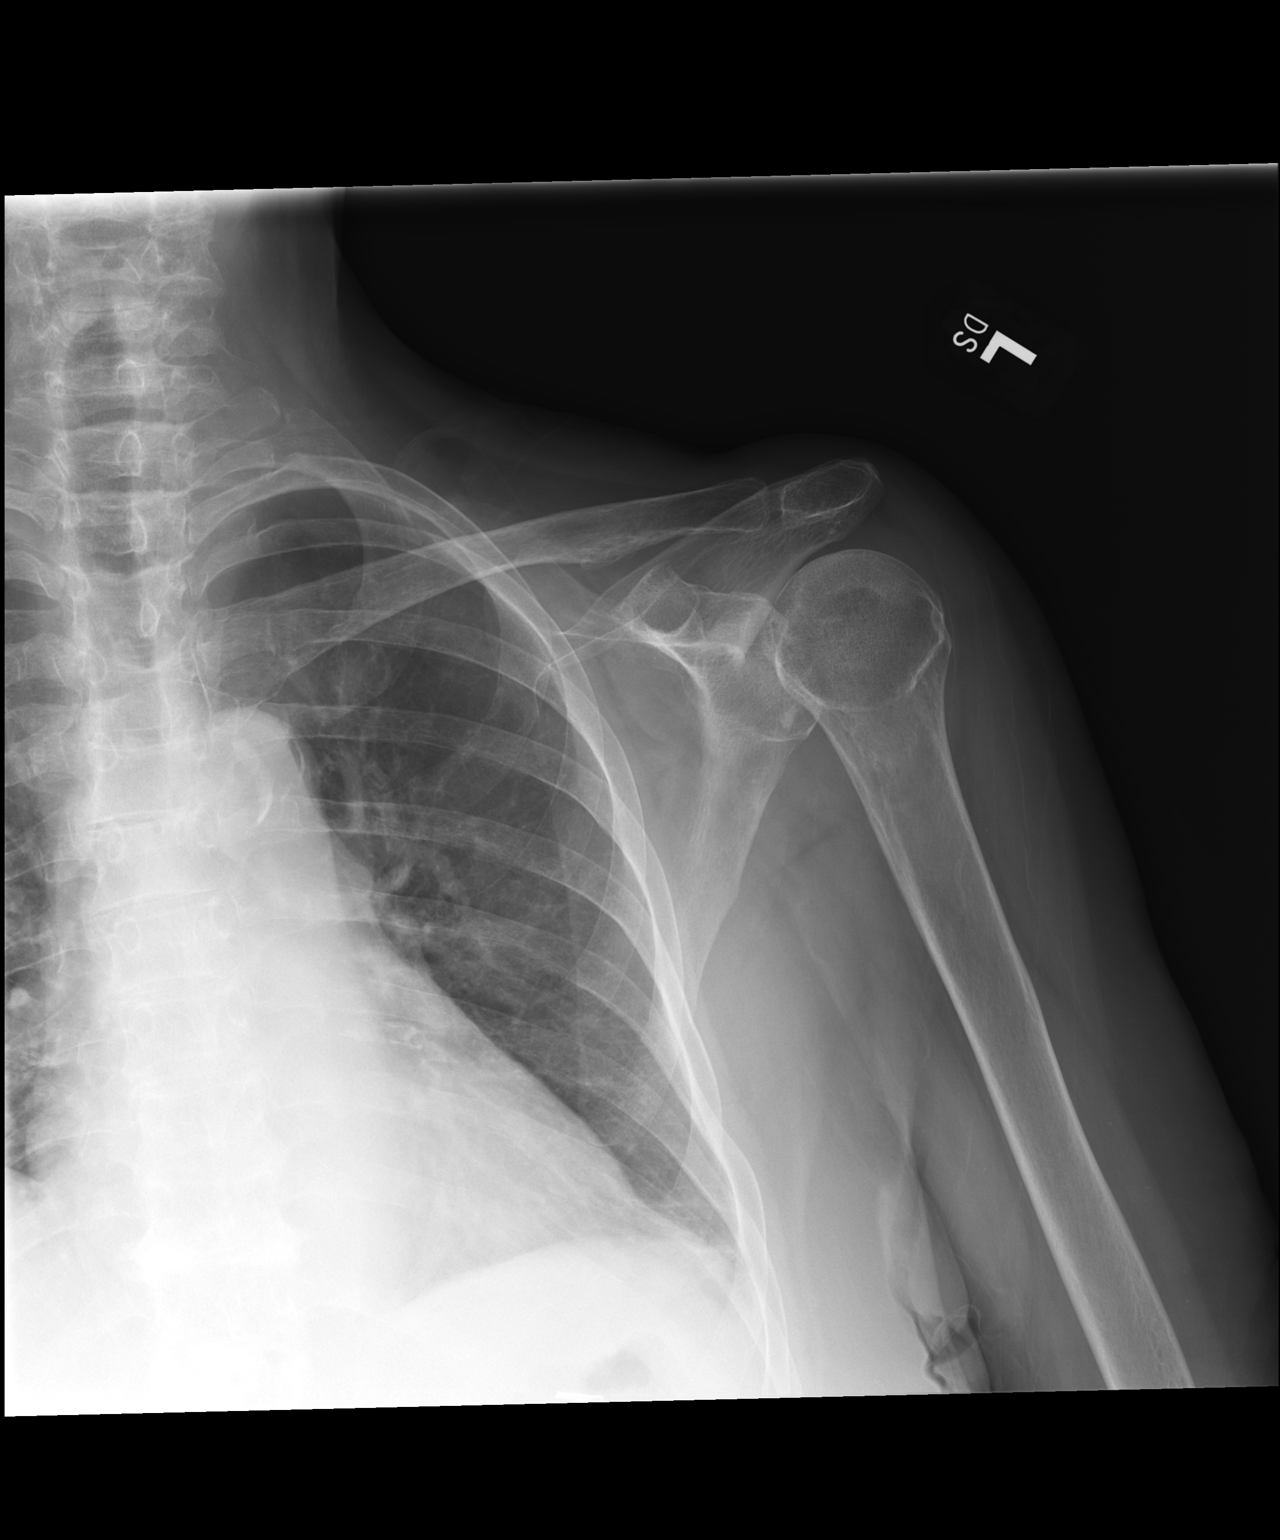
[im 2/3]
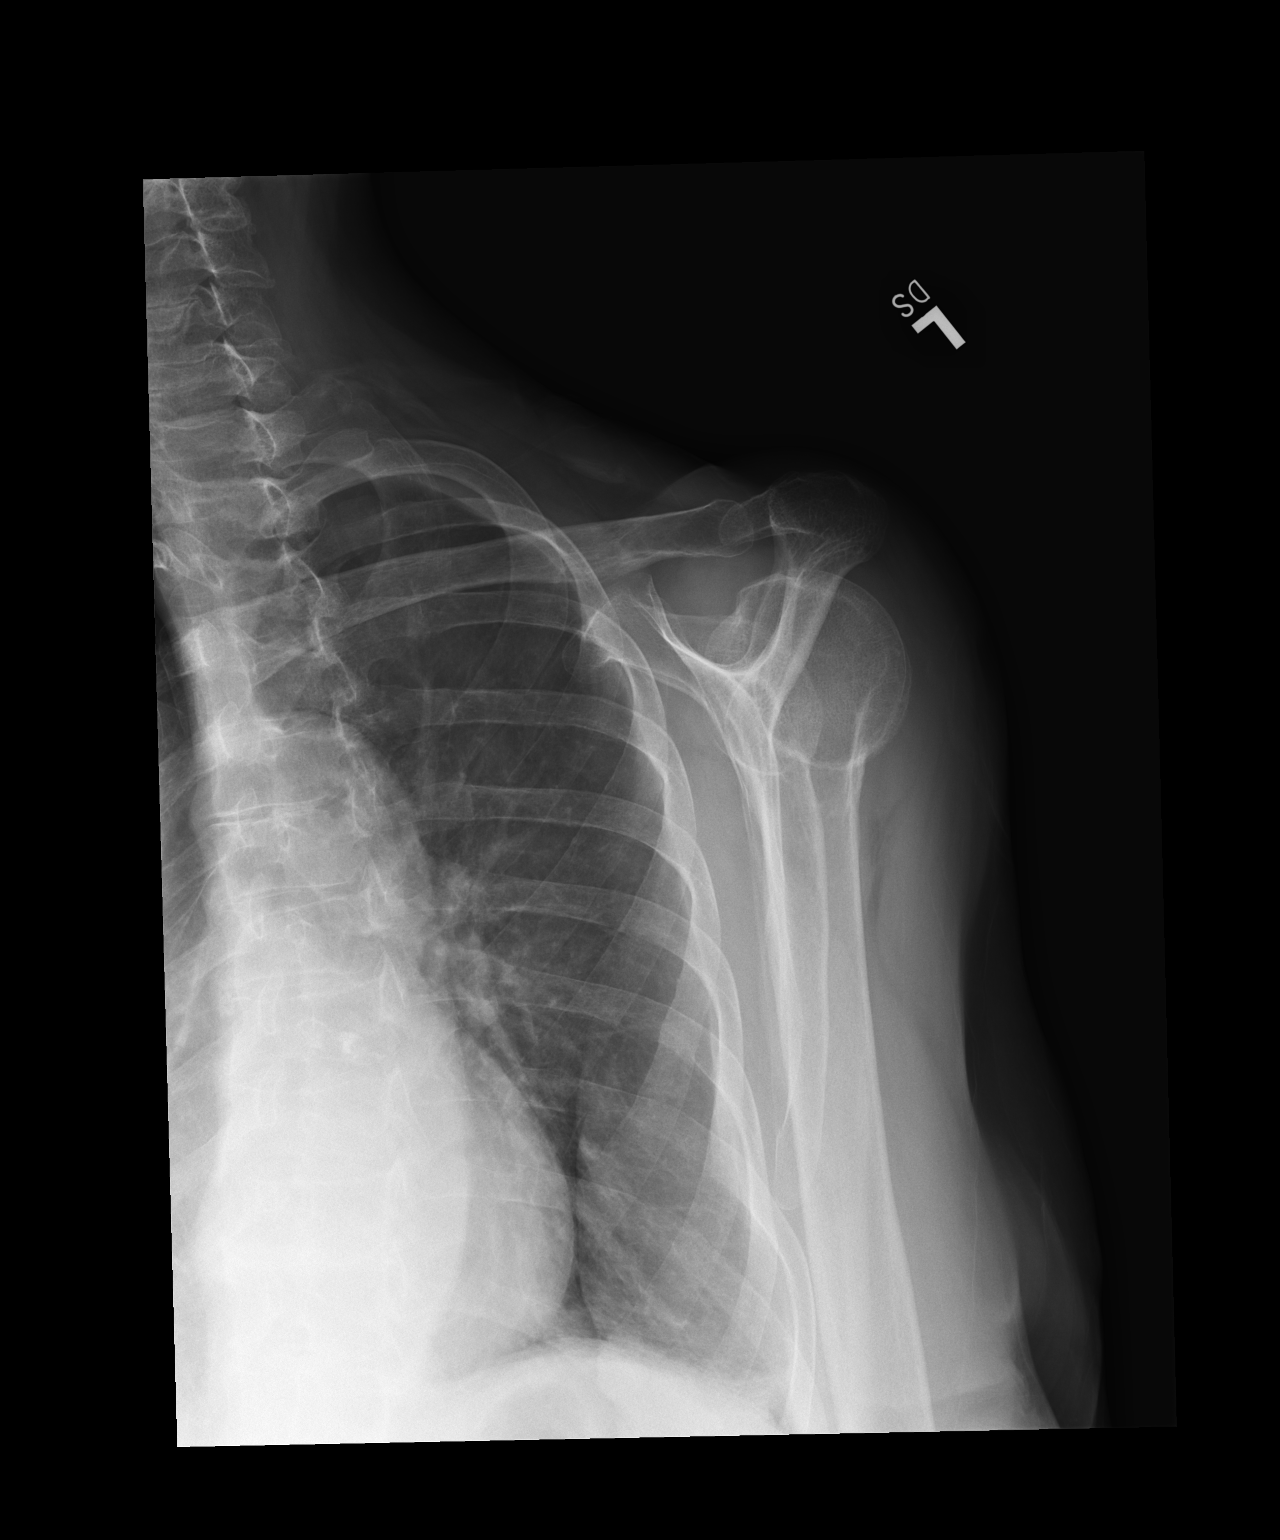
[im 3/3]
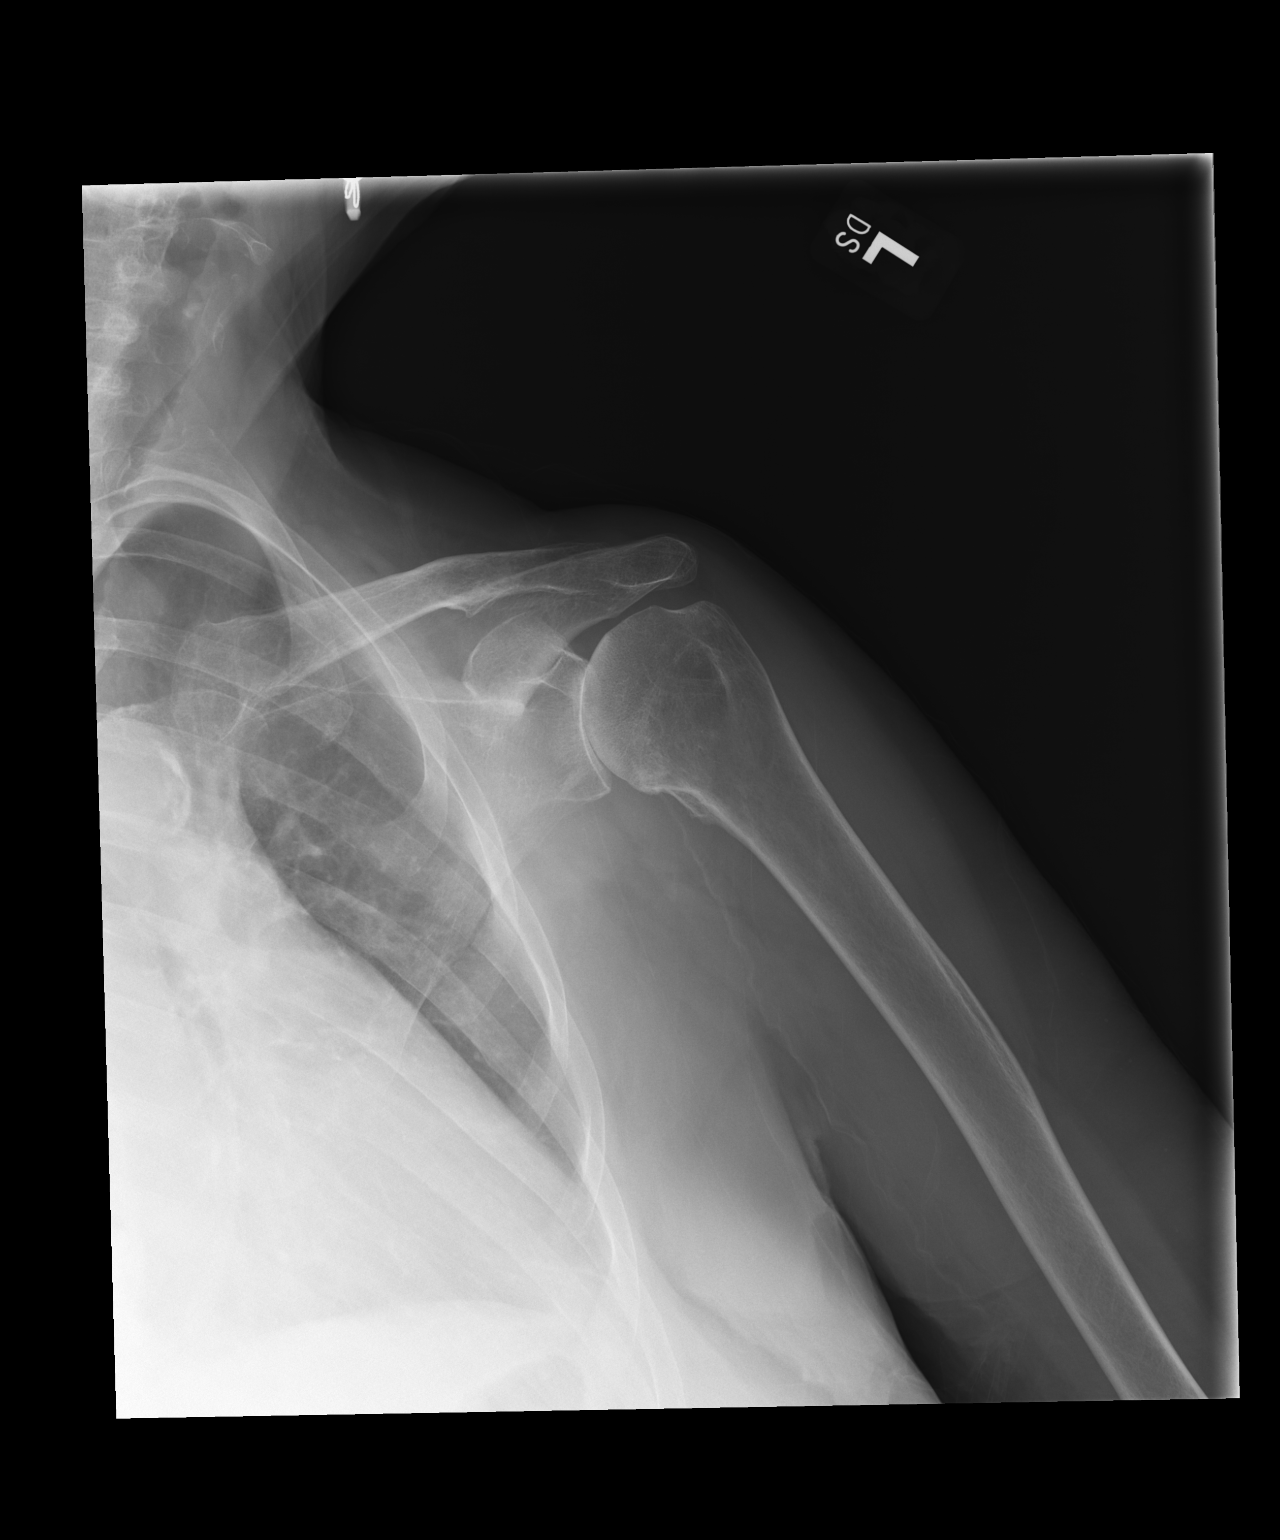

[3 of 3 positions shown; findings below may reference images not displayed]

FINDINGS: Marked left glenohumeral joint space narrowing. Right glenohumeral and 
bilateral AC joints are preserved. No fractures or dislocations. Osteoporosis. 
Prior granulomatous disease of the chest. Bibasilar atelectasis. 
Atherosclerosis.
IMPRESSION: 1.  Marked left glenohumeral joint space narrowing.  
2.  Right shoulder is preserved.

## 2022-09-17 IMAGING — DX HAND 3 VIEWS LEFT
1 series · 3 of 3 positions shown · non-contrast
Comparison: None.

________________________________________________________________________________________________ 
WRIST RIGHT 3 VIEWS, WRIST LEFT 3 VIEWS, HAND 3 VIEWS RIGHT, HAND 3 VIEWS LEFT, 
09/17/2022 [DATE]: 
CLINICAL INDICATION: Inflammatory Polyarthropathy.

[Series 1: PA · U · 0.14mm/px · 3 of 3 slices shown]
[im 1/3]
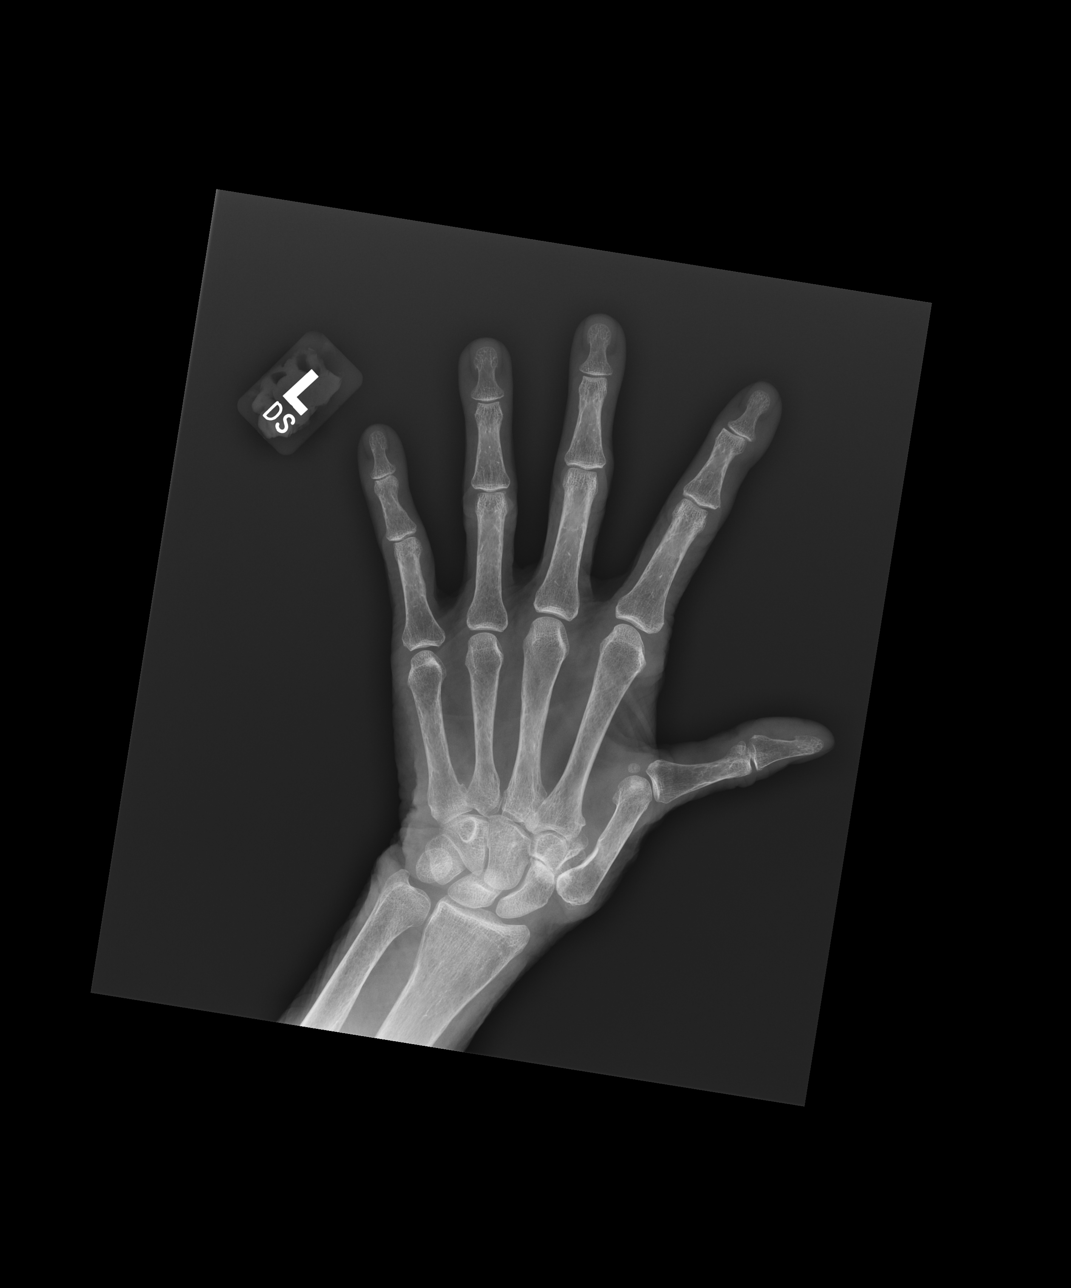
[im 2/3]
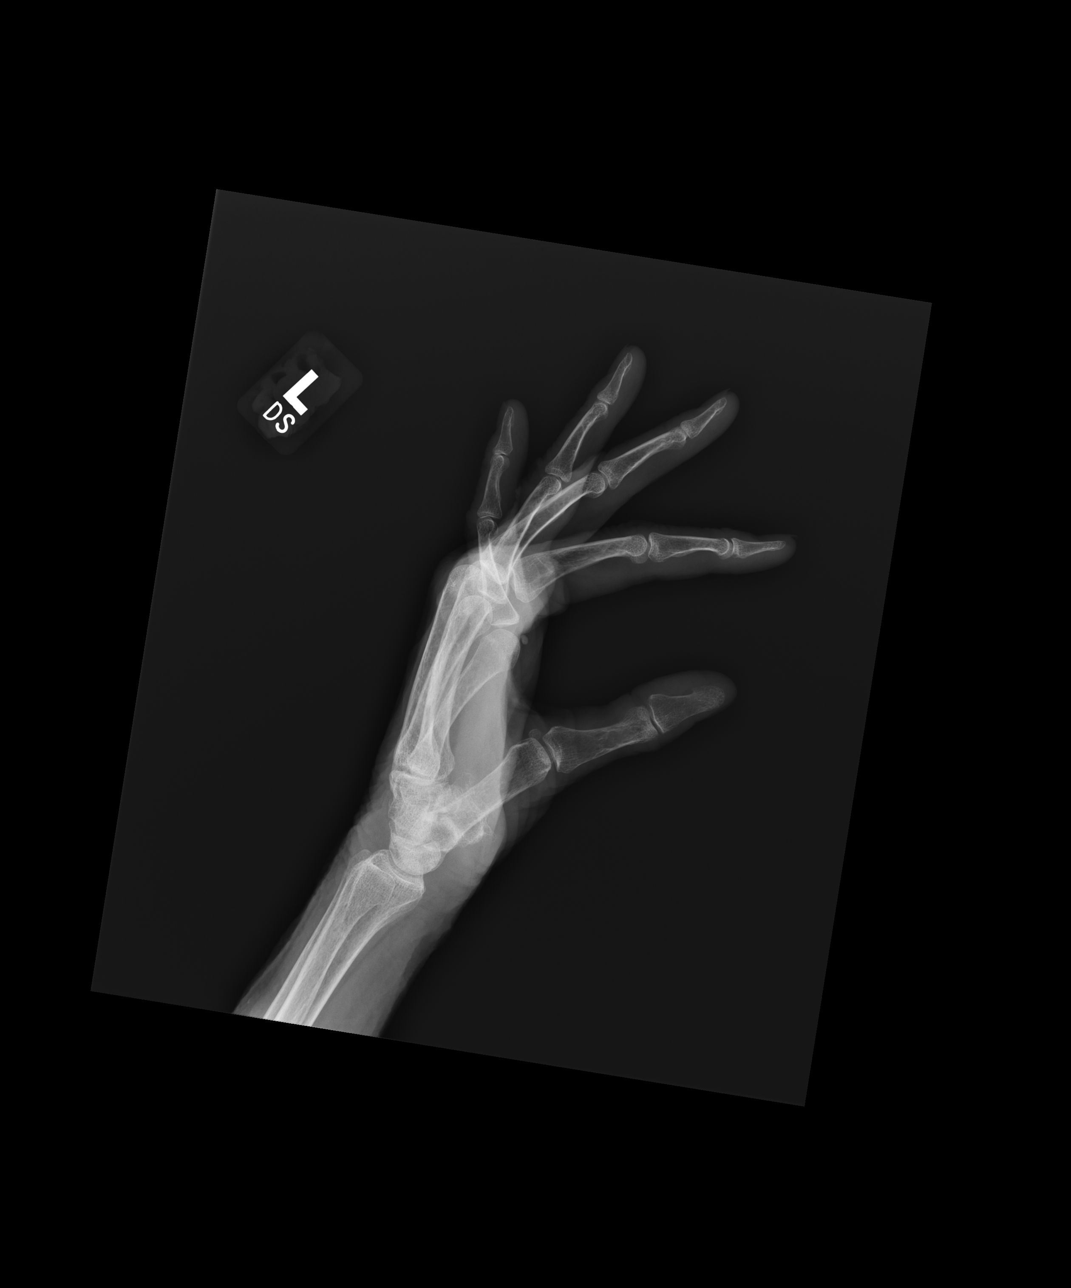
[im 3/3]
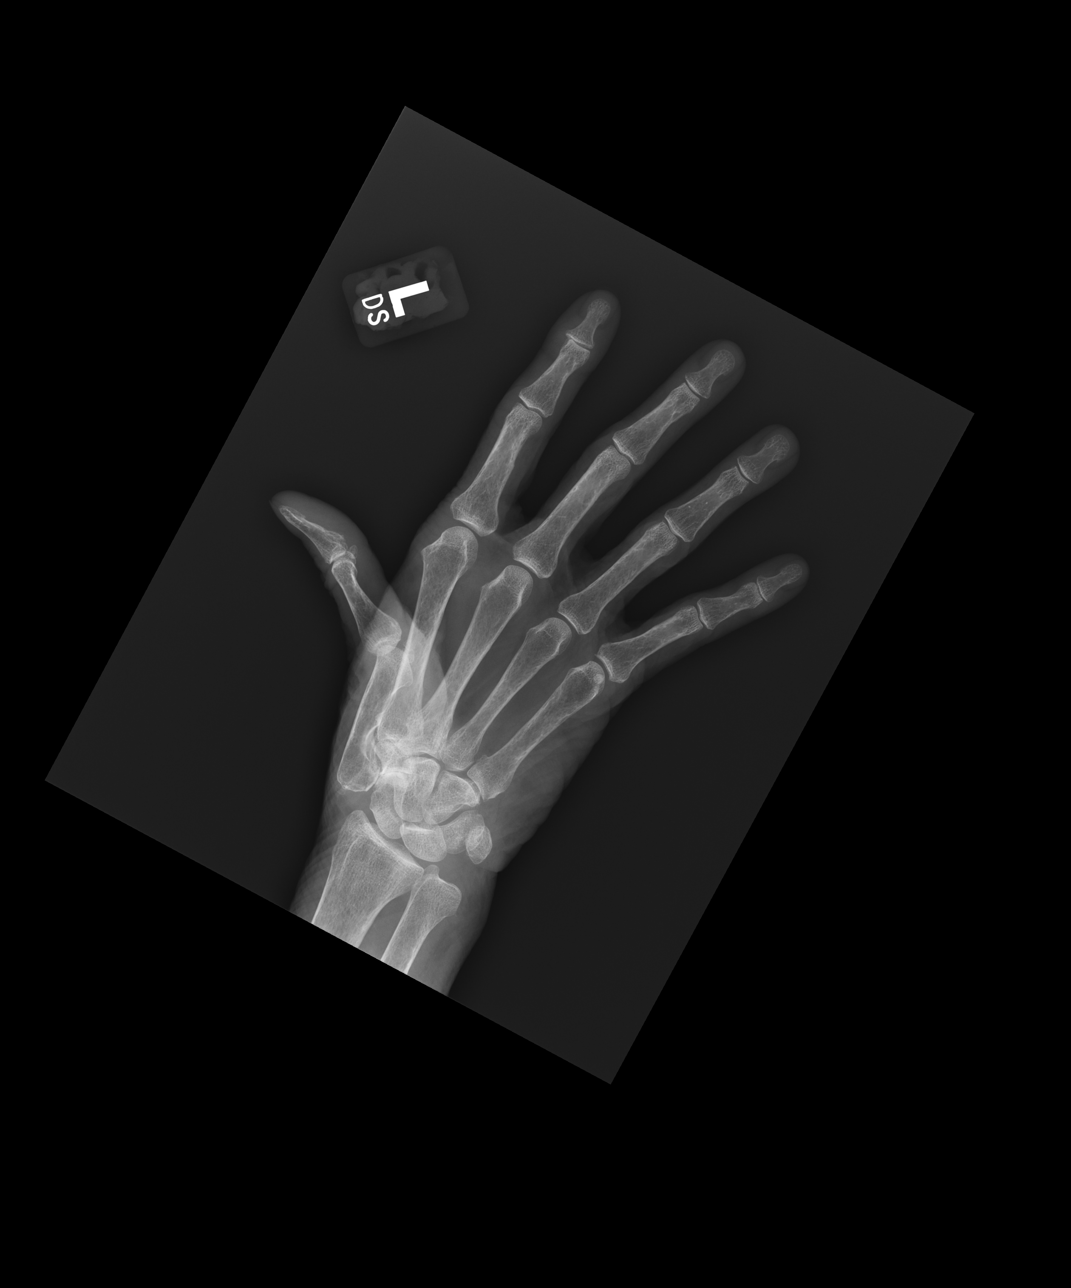

[3 of 3 positions shown; findings below may reference images not displayed]

FINDINGS: No erosions. Dislocations of the thumb carpometacarpal (K4K-L) joints 
with proximal and lateral subluxation of the thumb metacarpals. Extension of the 
thumbs at the metacarpophalangeal (MCP) joints. Marked degeneration of the 
scaphotrapeziotrapezoid (STT) complexes. Degenerative change of several 
interphalangeal (IP) joints. No fracture. Osteoporosis. No focal soft tissue 
swelling.
IMPRESSION: 1.  No erosions.  
2.  Dislocations of the thumbs, multifocal degenerative change and osteoporosis.

## 2022-09-17 IMAGING — DX WRIST RIGHT 3 VIEWS
1 series · 3 of 3 positions shown · non-contrast
Comparison: None.

________________________________________________________________________________________________ 
WRIST RIGHT 3 VIEWS, WRIST LEFT 3 VIEWS, HAND 3 VIEWS RIGHT, HAND 3 VIEWS LEFT, 
09/17/2022 [DATE]: 
CLINICAL INDICATION: Inflammatory Polyarthropathy.

[Series 1: PA · U · 0.14mm/px · 3 of 3 slices shown]
[im 1/3]
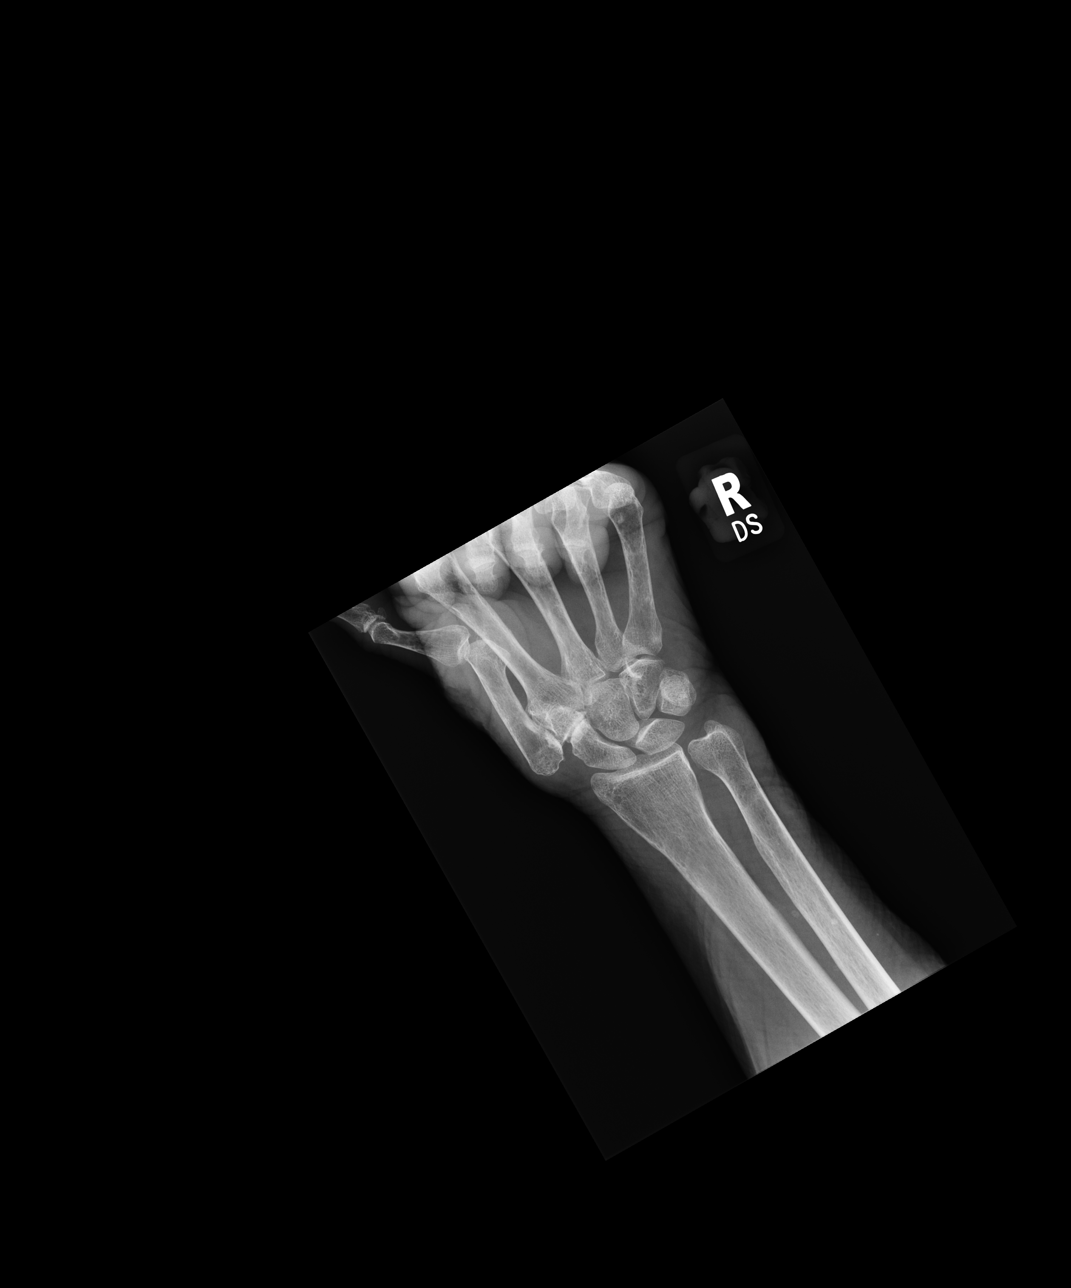
[im 2/3]
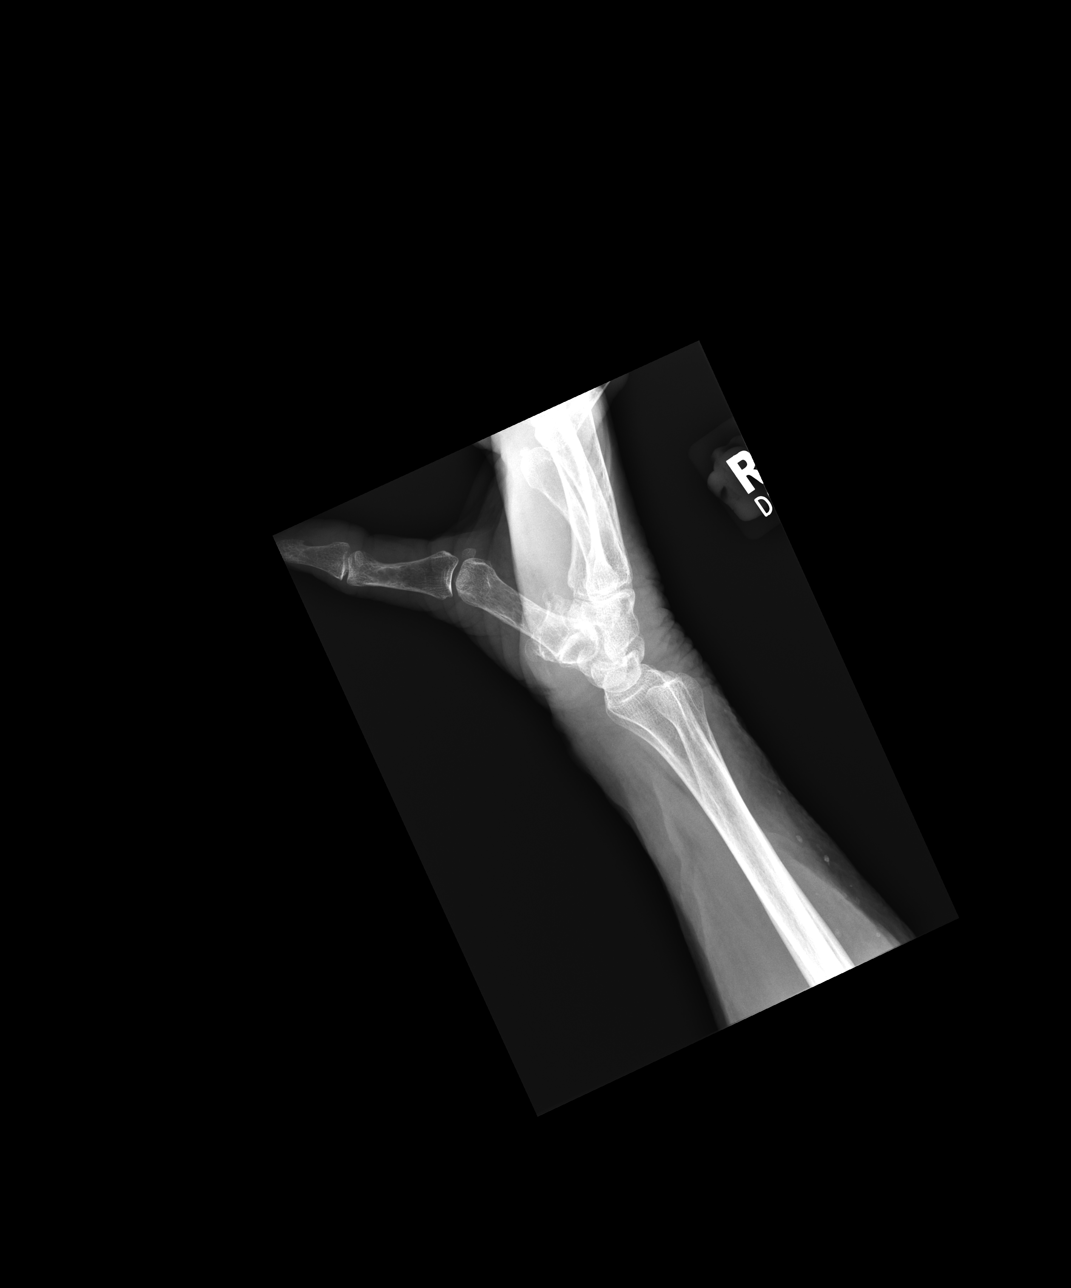
[im 3/3]
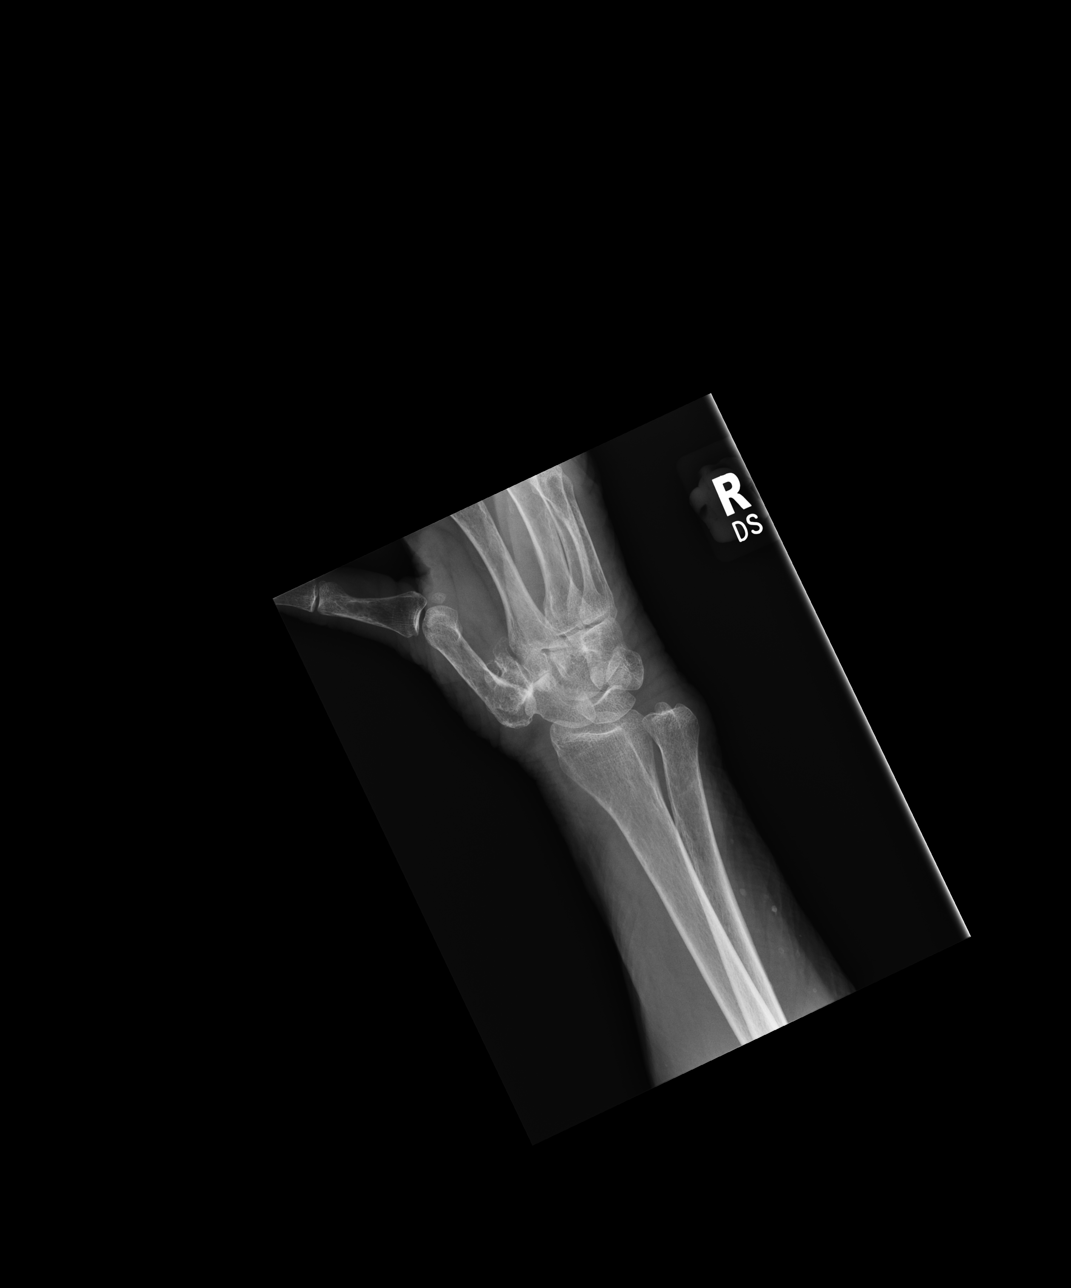

[3 of 3 positions shown; findings below may reference images not displayed]

FINDINGS: No erosions. Dislocations of the thumb carpometacarpal (K4K-L) joints 
with proximal and lateral subluxation of the thumb metacarpals. Extension of the 
thumbs at the metacarpophalangeal (MCP) joints. Marked degeneration of the 
scaphotrapeziotrapezoid (STT) complexes. Degenerative change of several 
interphalangeal (IP) joints. No fracture. Osteoporosis. No focal soft tissue 
swelling.
IMPRESSION: 1.  No erosions.  
2.  Dislocations of the thumbs, multifocal degenerative change and osteoporosis.

## 2022-09-17 IMAGING — DX SHOULDER RIGHT 3 VIEW
1 series · 3 of 3 positions shown · non-contrast
Comparison: None.

________________________________________________________________________________________________ 
SHOULDER LEFT 3 VIEW, SHOULDER RIGHT 3 VIEW, 09/17/2022 [DATE]: 
CLINICAL INDICATION: Shoulder pain.

[Series 1: ap int rot · U · 0.14mm/px · 3 of 3 slices shown]
[im 1/3]
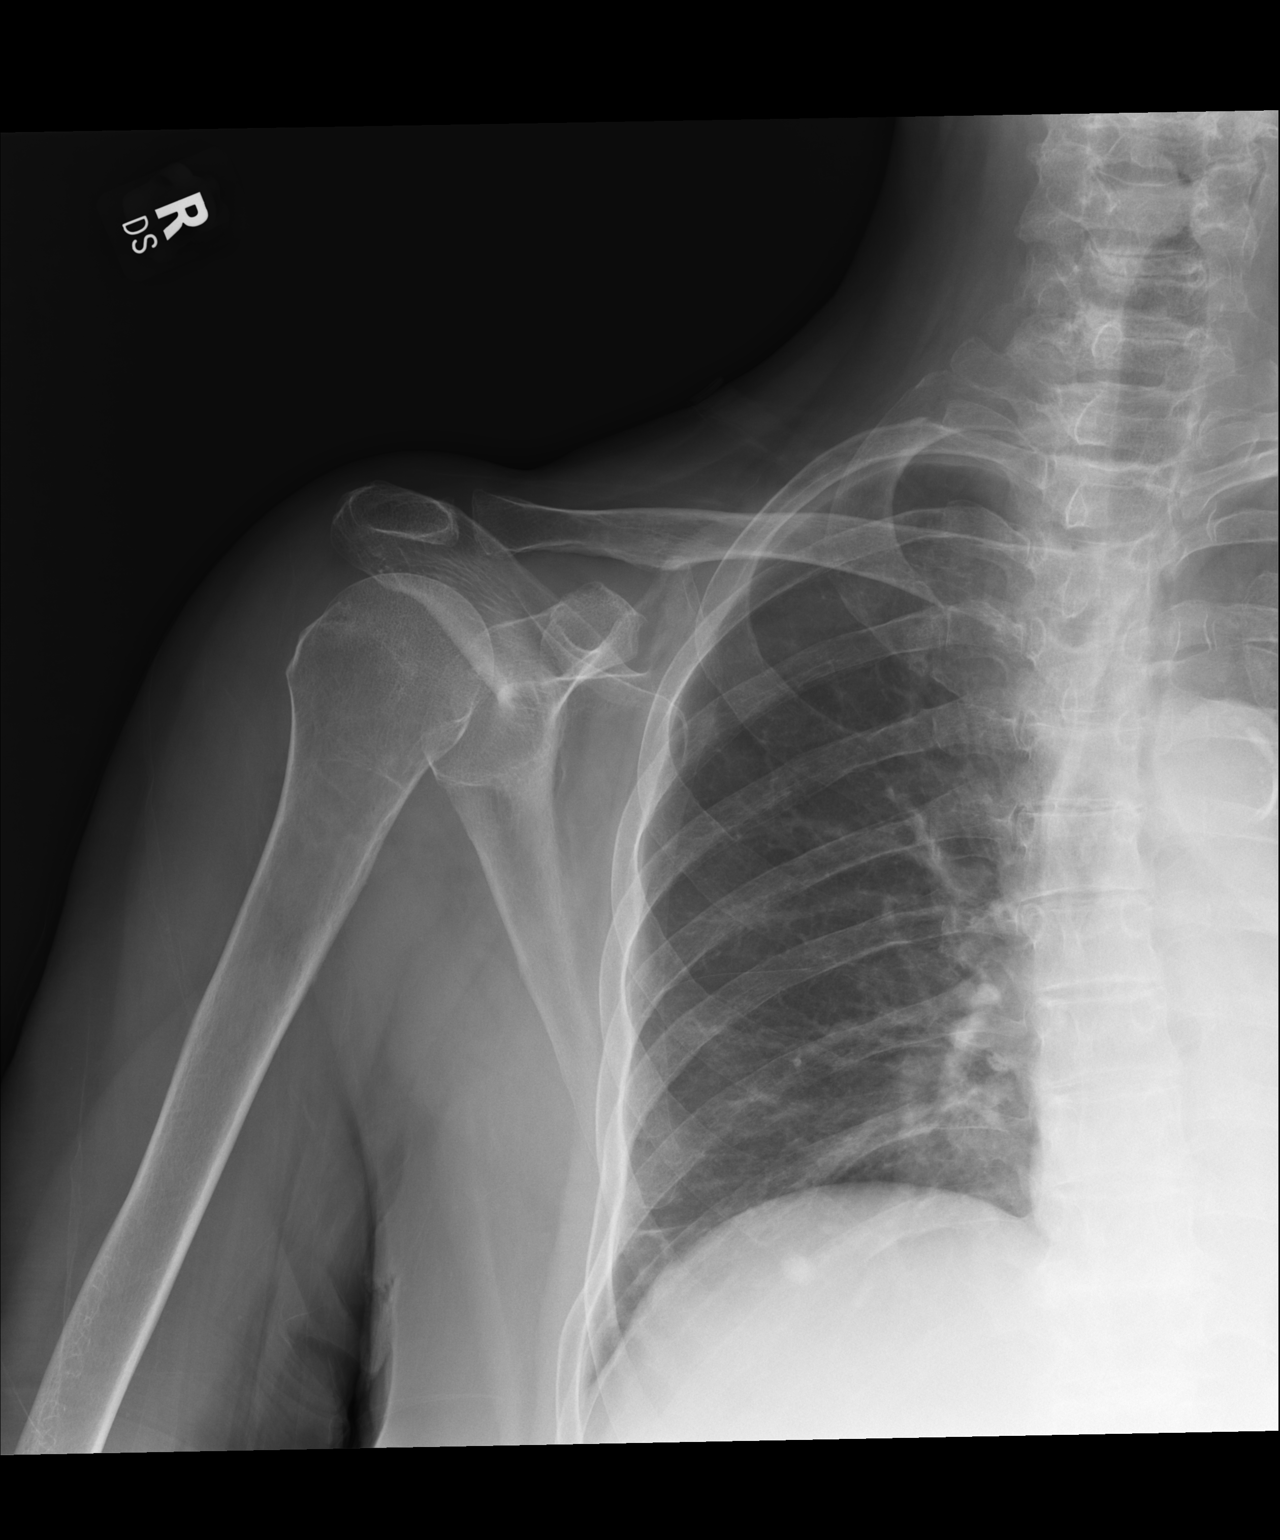
[im 2/3]
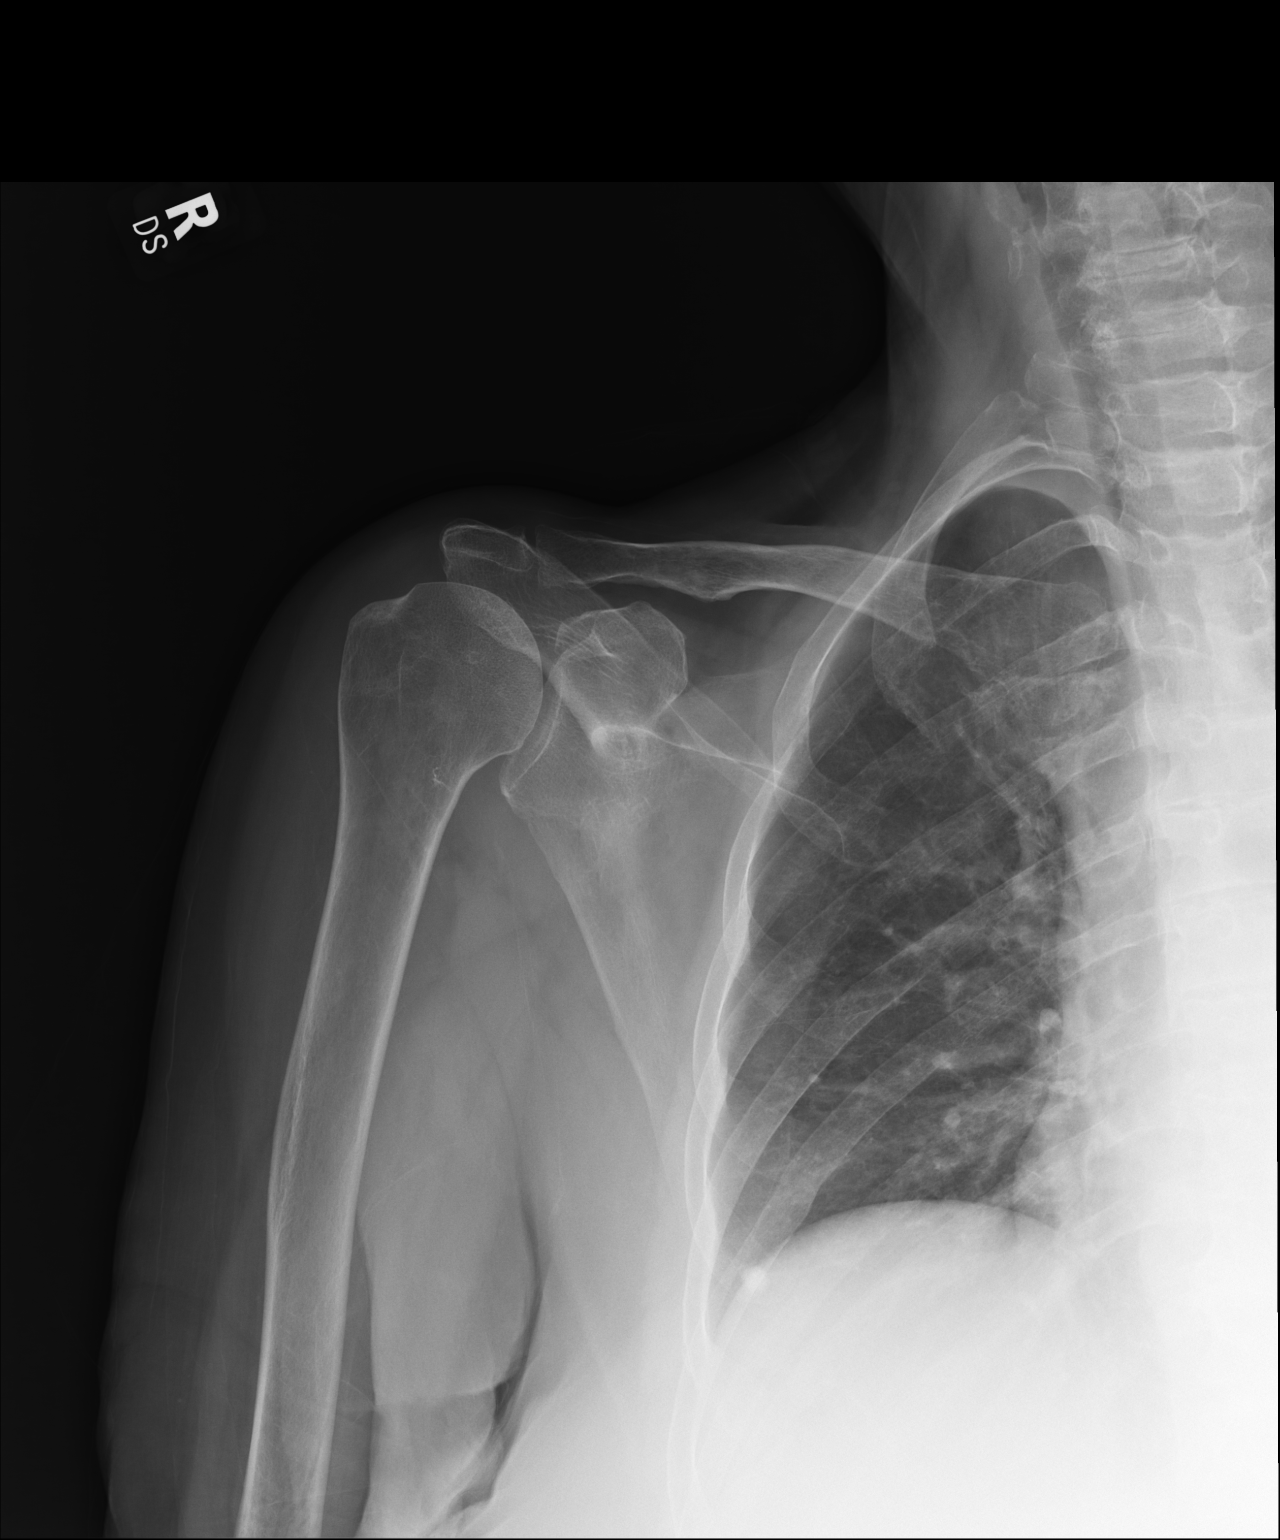
[im 3/3]
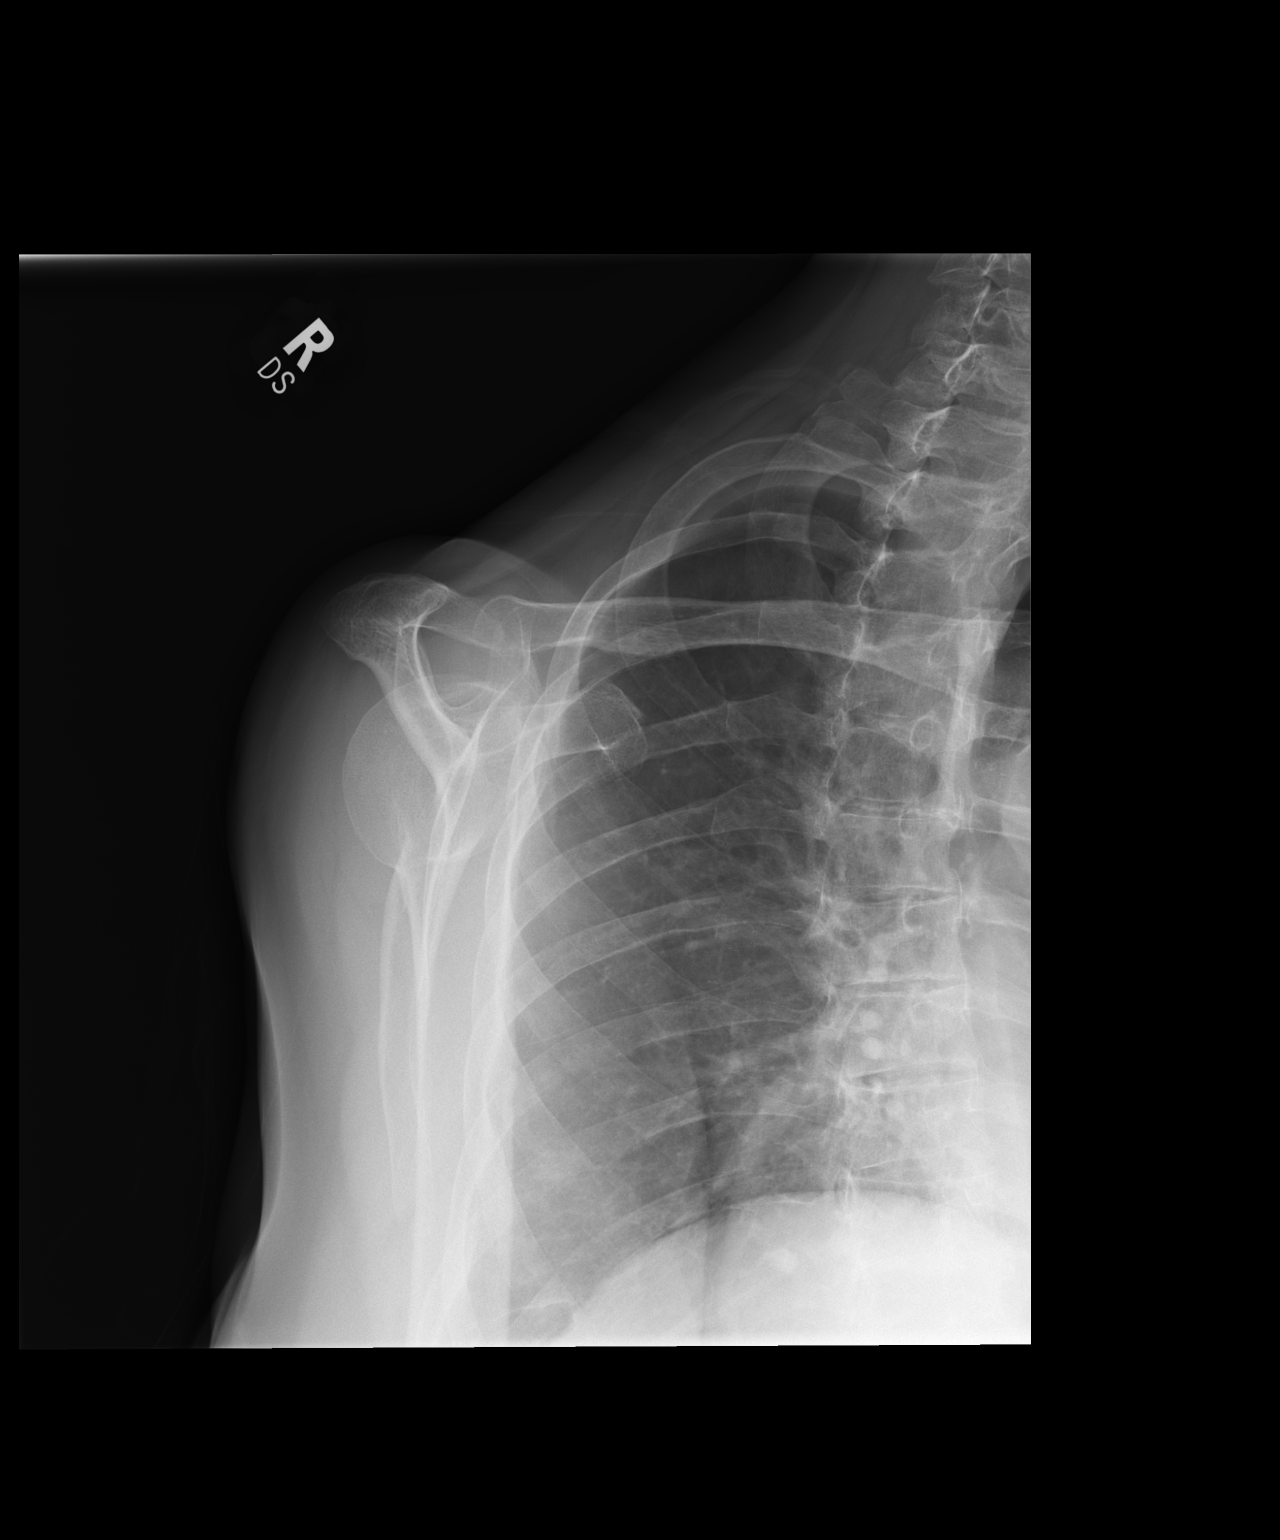

[3 of 3 positions shown; findings below may reference images not displayed]

FINDINGS: Marked left glenohumeral joint space narrowing. Right glenohumeral and 
bilateral AC joints are preserved. No fractures or dislocations. Osteoporosis. 
Prior granulomatous disease of the chest. Bibasilar atelectasis. 
Atherosclerosis.
IMPRESSION: 1.  Marked left glenohumeral joint space narrowing.  
2.  Right shoulder is preserved.

## 2022-09-17 IMAGING — DX HAND 3 VIEWS RIGHT
1 series · 3 of 3 positions shown · non-contrast
Comparison: None.

________________________________________________________________________________________________ 
WRIST RIGHT 3 VIEWS, WRIST LEFT 3 VIEWS, HAND 3 VIEWS RIGHT, HAND 3 VIEWS LEFT, 
09/17/2022 [DATE]: 
CLINICAL INDICATION: Inflammatory Polyarthropathy.

[Series 1: PA · U · 0.14mm/px · 3 of 3 slices shown]
[im 1/3]
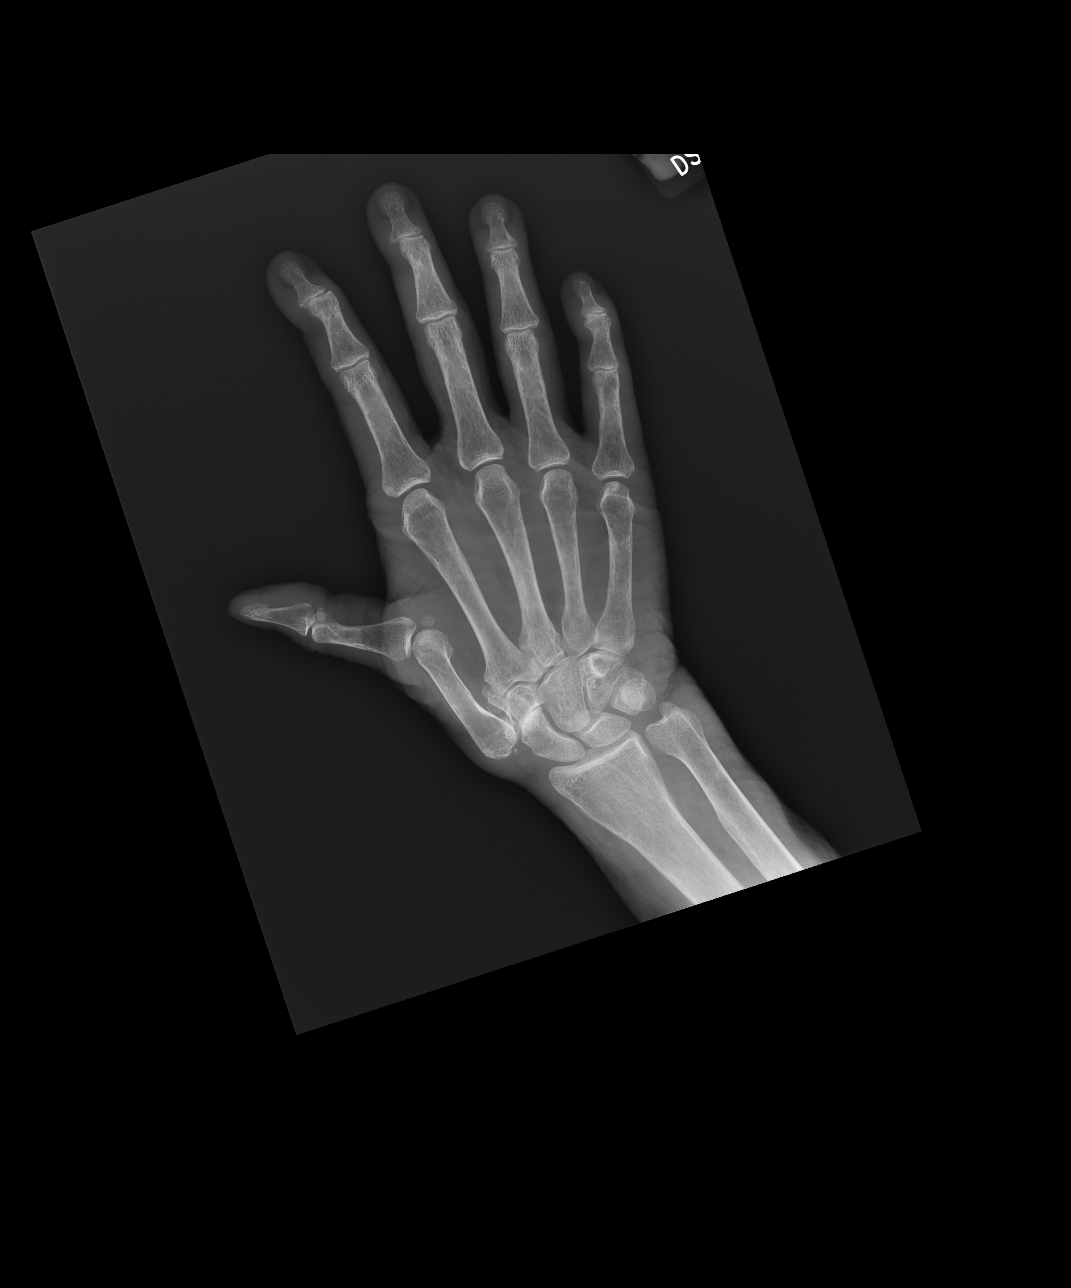
[im 2/3]
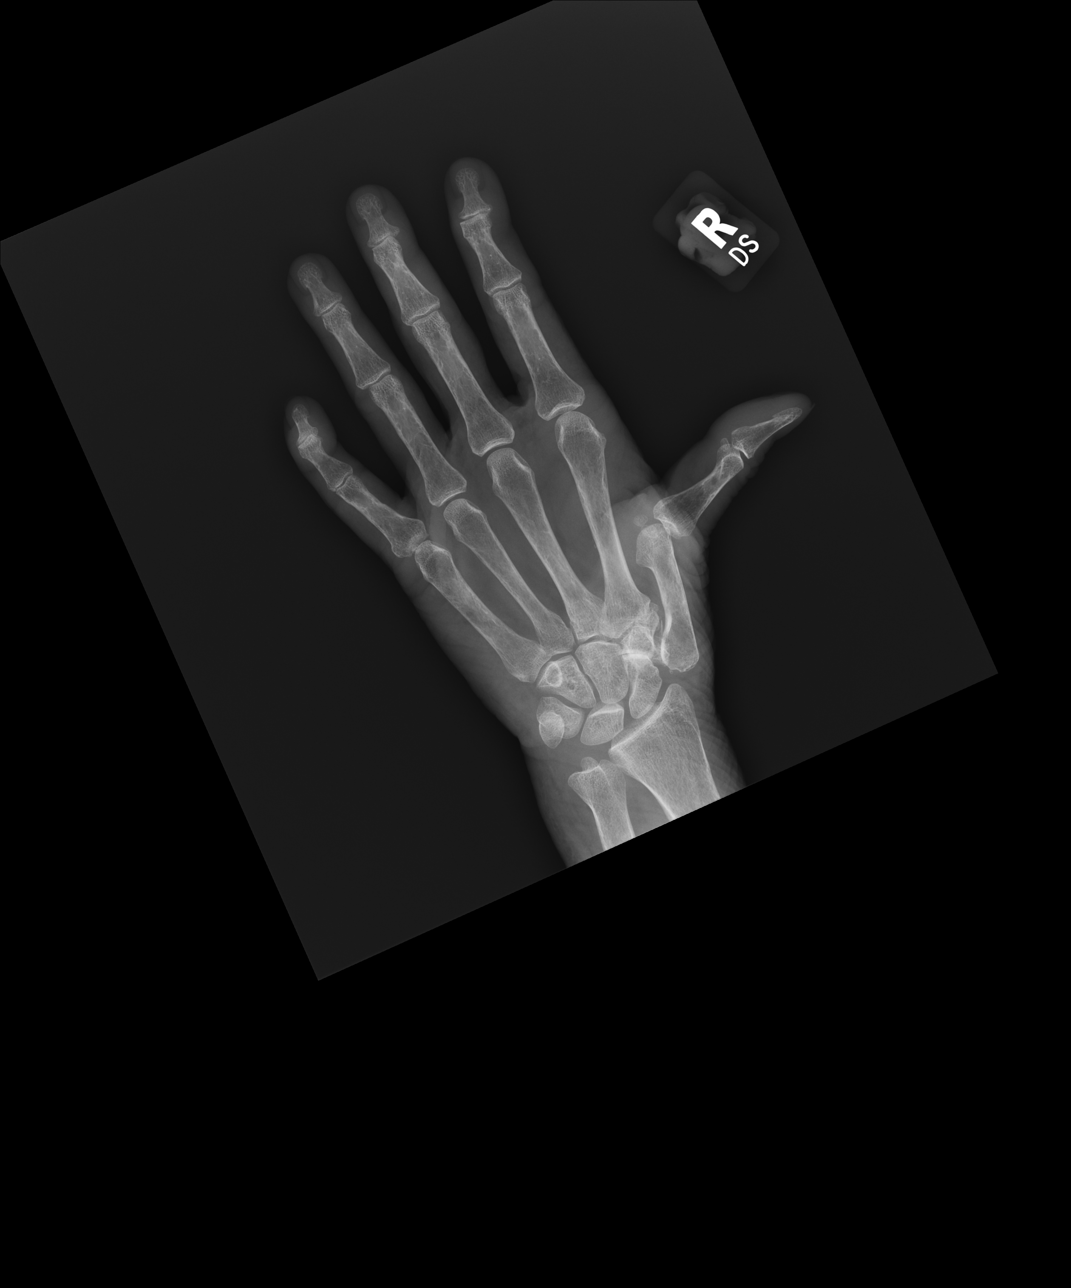
[im 3/3]
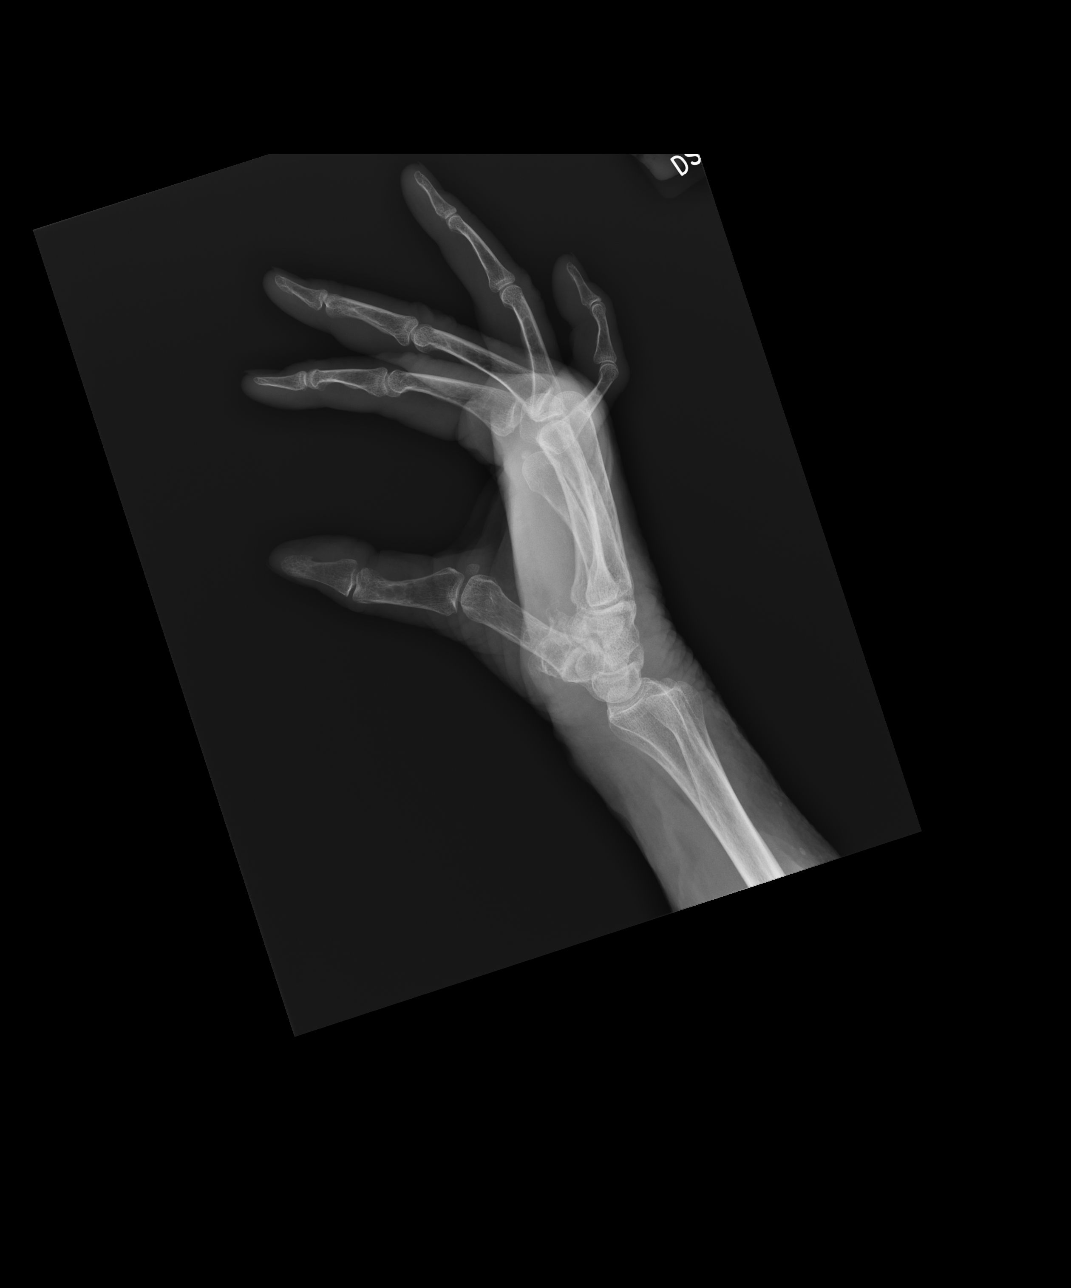

[3 of 3 positions shown; findings below may reference images not displayed]

FINDINGS: No erosions. Dislocations of the thumb carpometacarpal (K4K-L) joints 
with proximal and lateral subluxation of the thumb metacarpals. Extension of the 
thumbs at the metacarpophalangeal (MCP) joints. Marked degeneration of the 
scaphotrapeziotrapezoid (STT) complexes. Degenerative change of several 
interphalangeal (IP) joints. No fracture. Osteoporosis. No focal soft tissue 
swelling.
IMPRESSION: 1.  No erosions.  
2.  Dislocations of the thumbs, multifocal degenerative change and osteoporosis.

## 2022-09-17 IMAGING — DX HIP BILATERAL 2 VIEWS
1 series · 5 of 5 positions shown · non-contrast
Comparison: 03/01/2020 CT pelvis

________________________________________________________________________________________________ 
HIP BILATERAL 2 VIEWS, 09/17/2022 [DATE]: 
CLINICAL INDICATION: Inflammatory Polyarthropathy.

[Series 1: AP · U · 0.14mm/px · 5 of 5 slices shown]
[im 1/5]
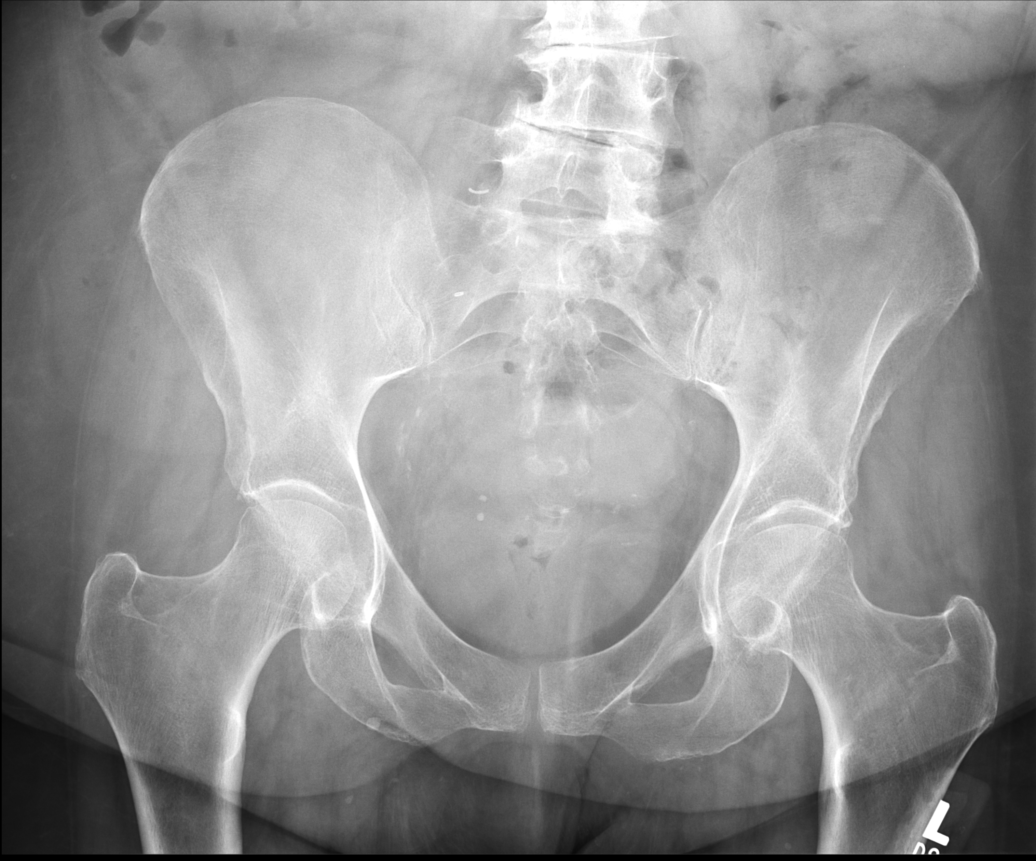
[im 2/5]
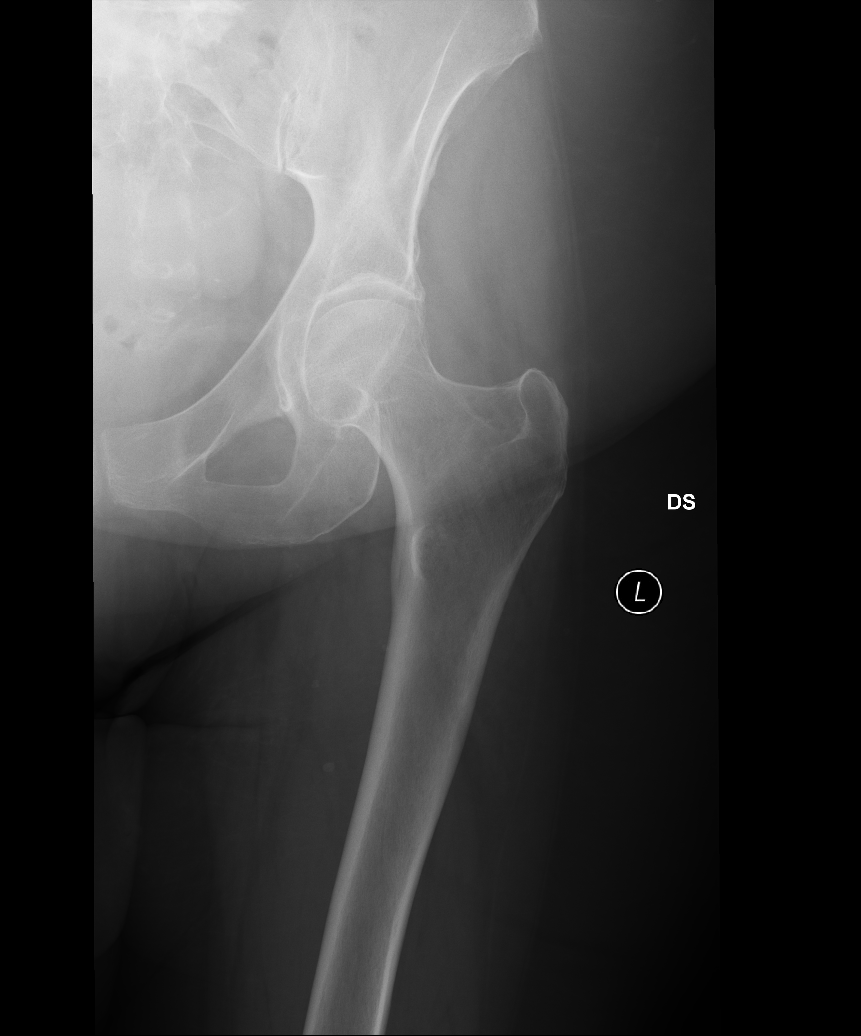
[im 3/5]
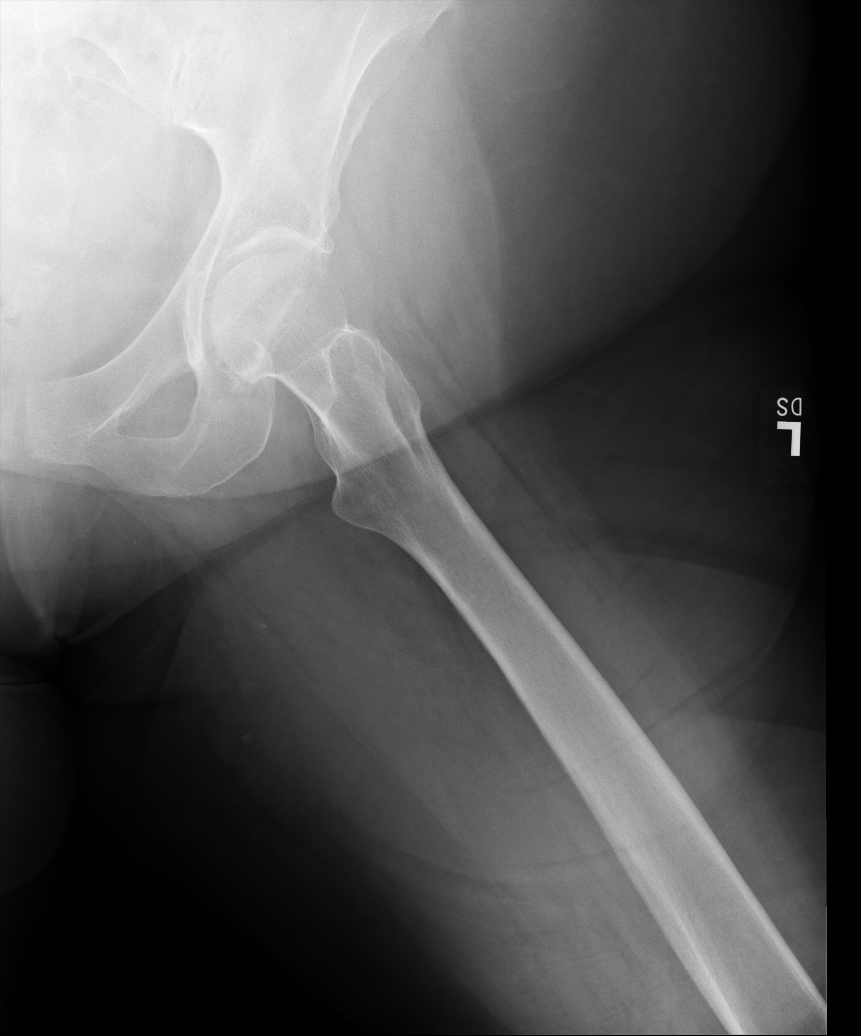
[im 4/5]
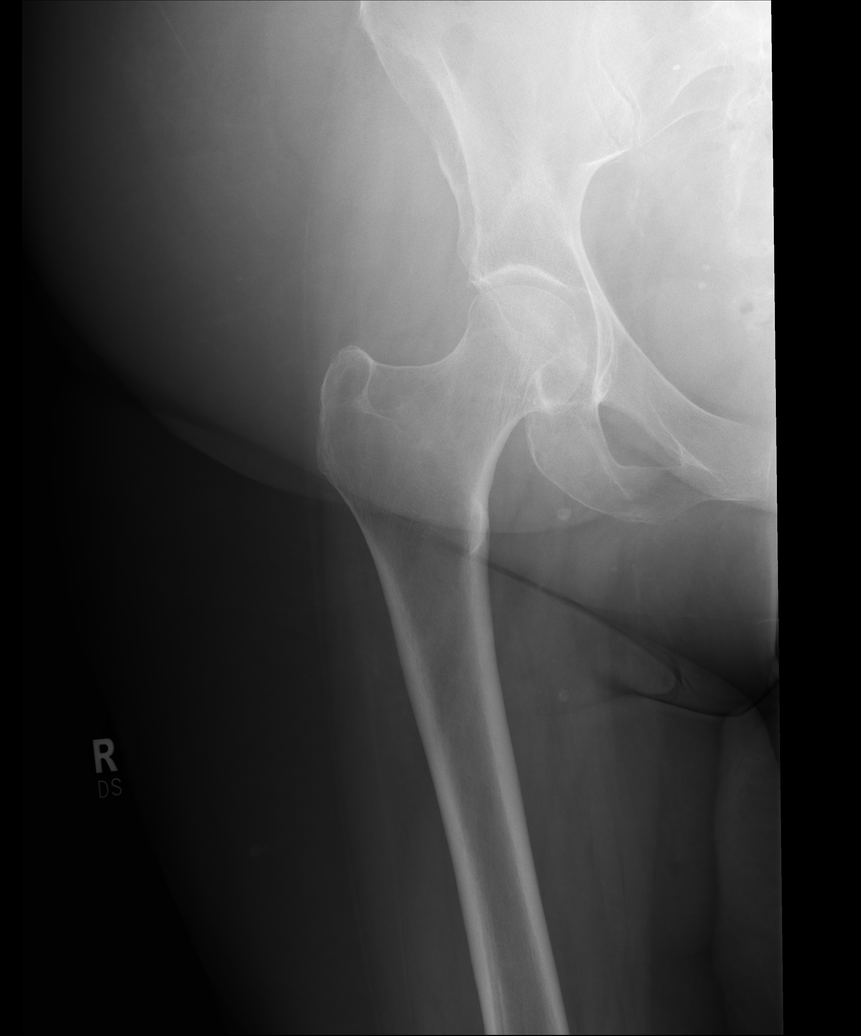
[im 5/5]
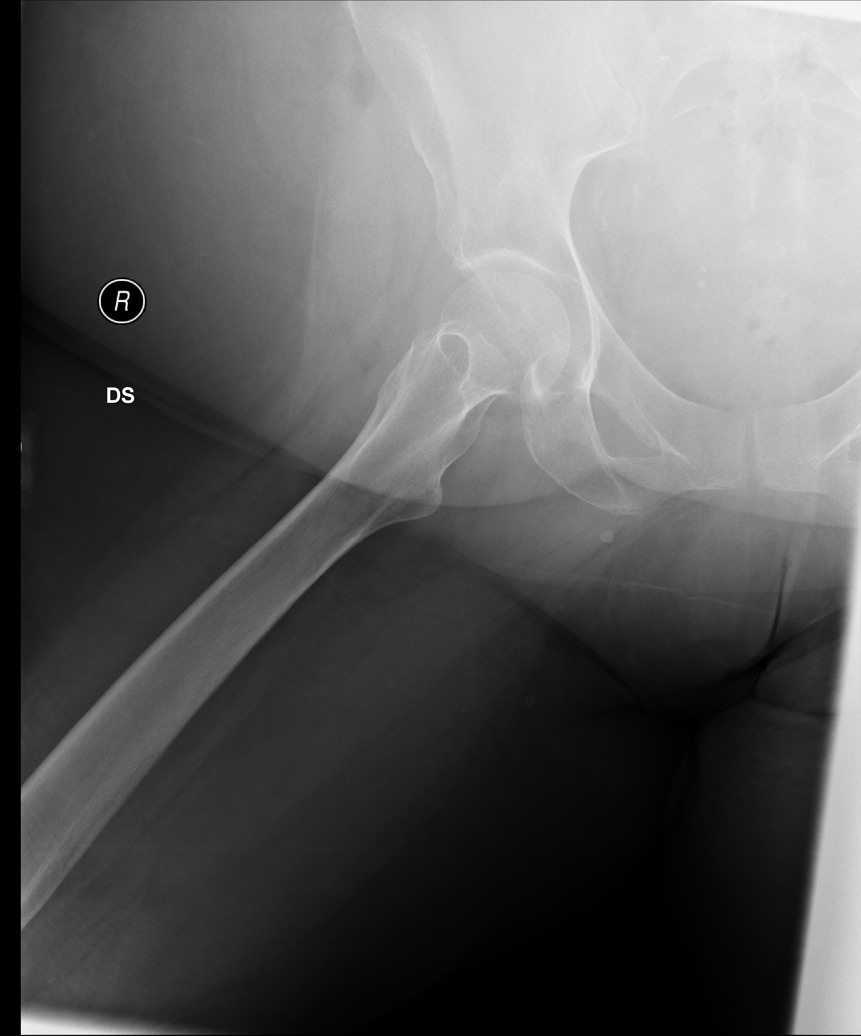

[5 of 5 positions shown; findings below may reference images not displayed]

FINDINGS: No erosions or fracture. Mild degenerative change of the hips and 
multilevel degenerative change of the spine. Osteopenia. Pelvic clips and 
phleboliths.
IMPRESSION: 1.  No erosions. 
2.  Mild degenerative change of the hips and multilevel degenerative change of 
the spine.

## 2022-10-30 IMAGING — MR MRI RIGHT WRIST WITHOUT CONTRAST
4 of 6 series · 18 of 40 positions shown · IV contrast (gadolinium)
Comparison: Radiograph of 09/17/2022 and 05/29/2021

________________________________________________________________________________________________ 
MRI RIGHT HAND WITHOUT CONTRAST, MRI RIGHT WRIST WITHOUT CONTRAST, 10/30/2022 
[DATE]: 
CLINICAL INDICATION: Inflammatory Polyarthropathy history of right hand and 
wrist pain with swelling. Undifferentiated inflammatory arthritis.
TECHNIQUE: Multiplanar, multiecho position MR images of the hand and wrist were 
performed without intravenous gadolinium enhancement. Patient was scanned on a 
1.5T magnet. Exams were performed on the same day and are read in conjunction 
with each other.

[Series 301: T1 · axial · right · 3.0mm · 0.14mm/px · z∈[-40,+19]mm · 3 of 25 slices shown]
[im 4/25]
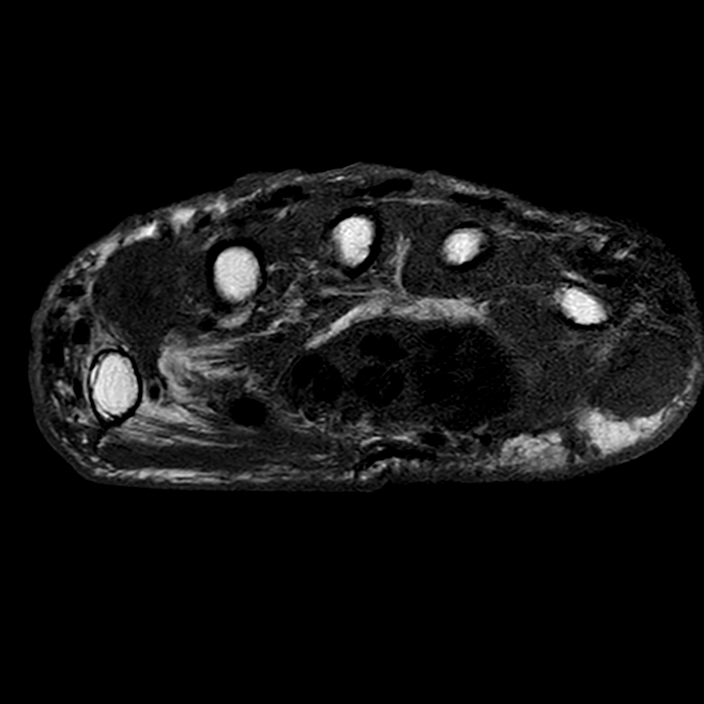
[im 14/25]
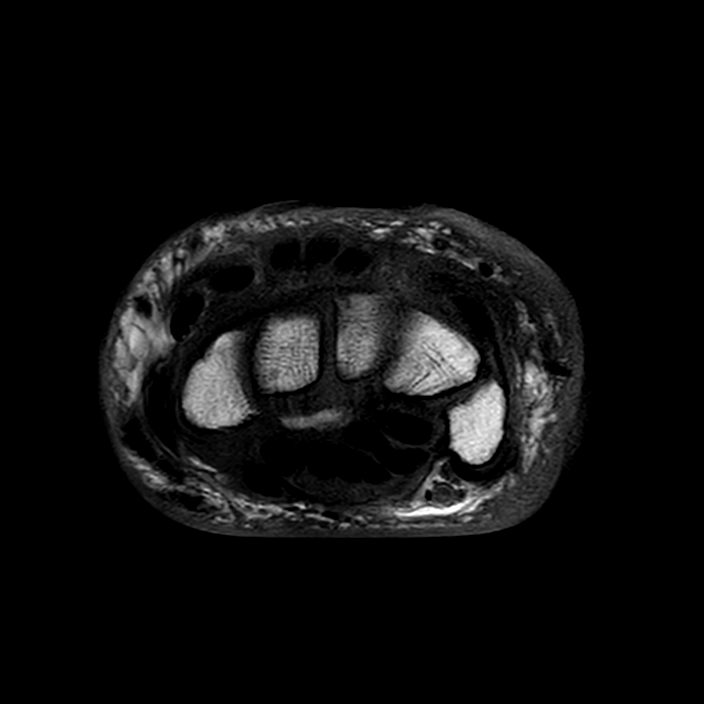
[im 21/25]
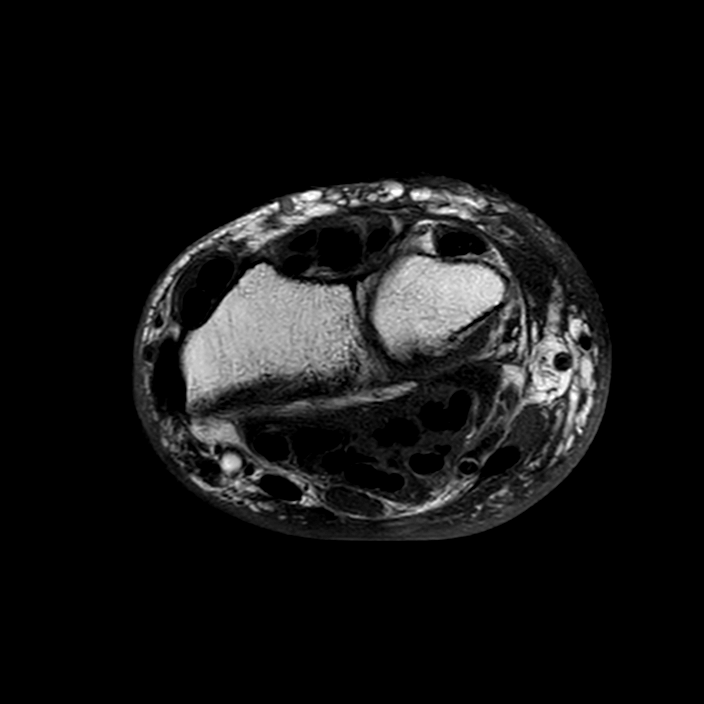

[Series 401: PD fat-sat · axial · right · 3.0mm · 0.19mm/px · z∈[-50,+34]mm · 8 of 25 slices shown (1 of 3)]
[im 1/25]
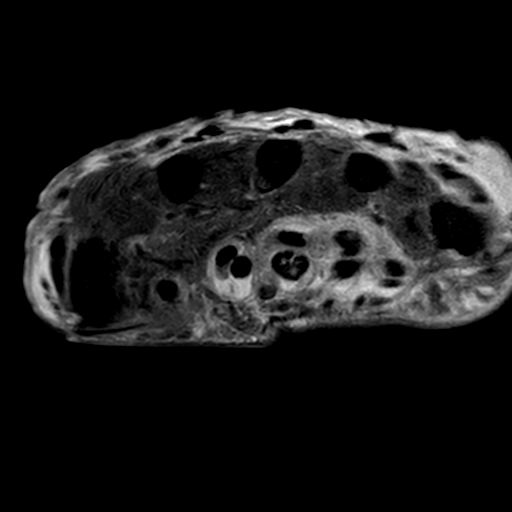
[im 4/25]
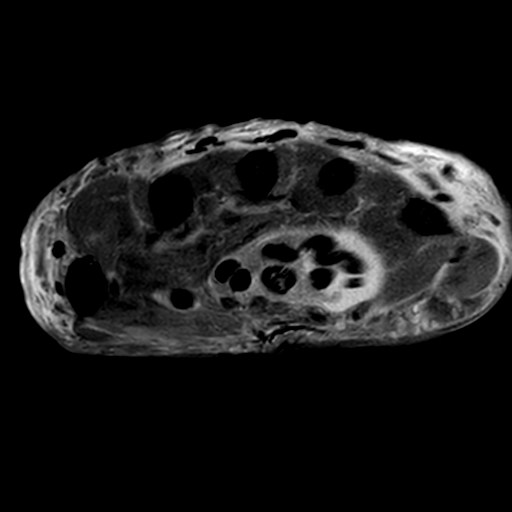
[im 7/25]
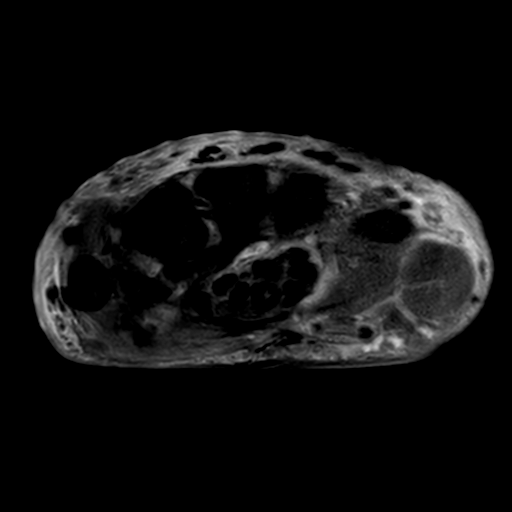
[im 11/25]
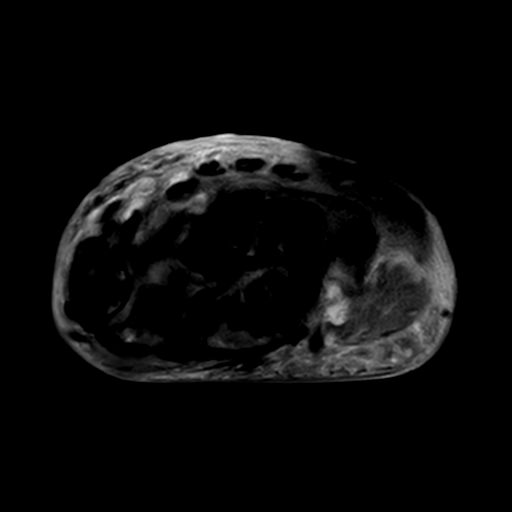
[im 14/25]
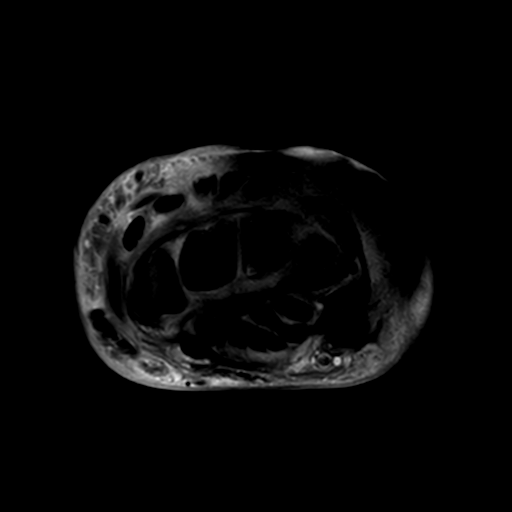
[im 18/25]
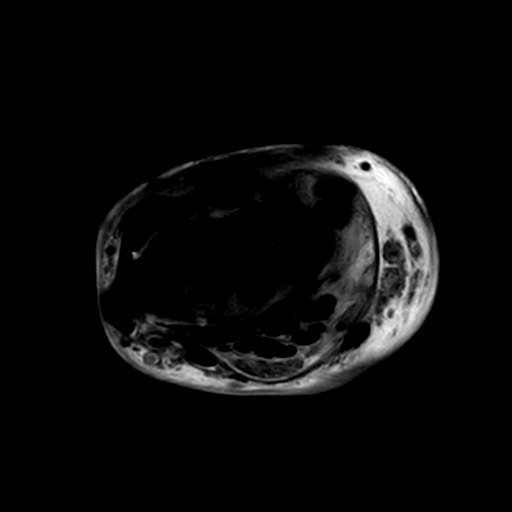
[im 21/25]
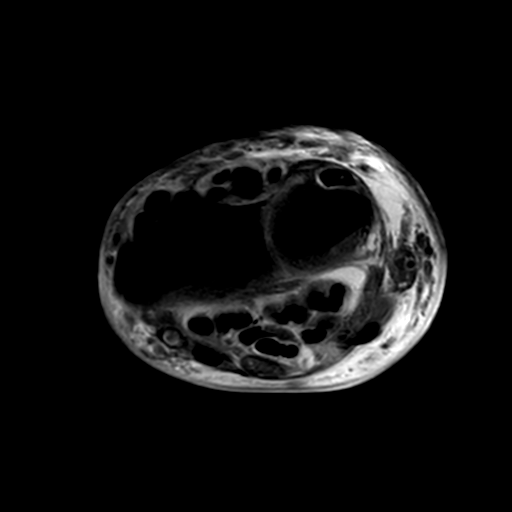
[im 25/25]
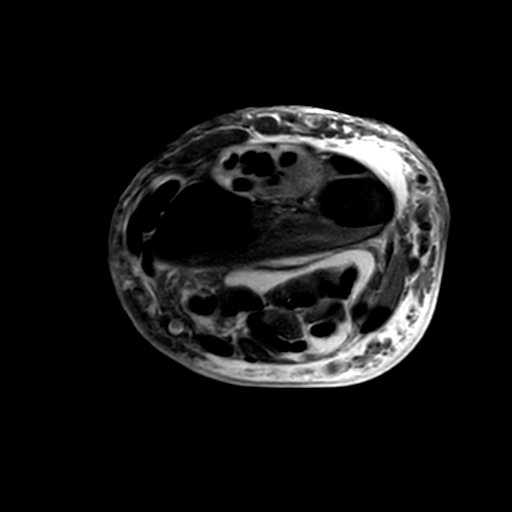

[Series 601: PD fat-sat · coronal · right · 2.5mm · 0.23mm/px · 4 of 22 slices shown (2 of 3)]
[im 1/22]
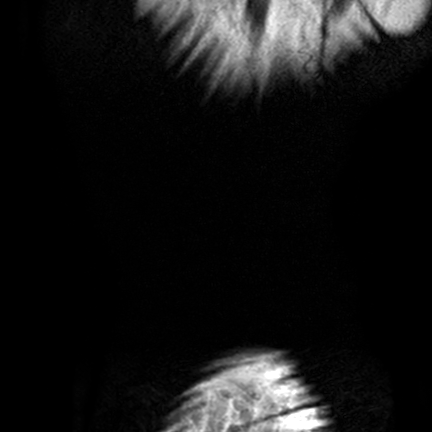
[im 4/22]
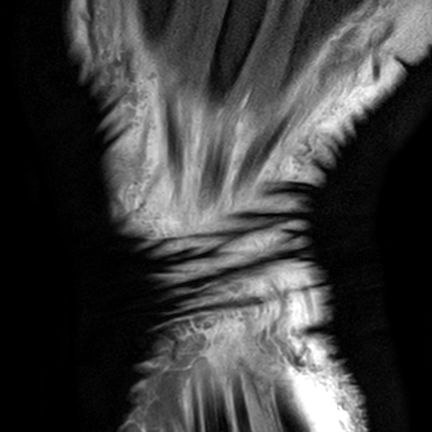
[im 11/22]
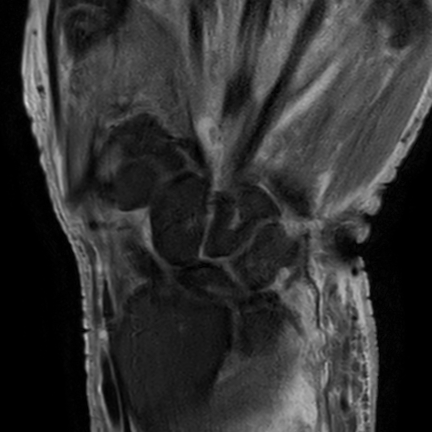
[im 18/22]
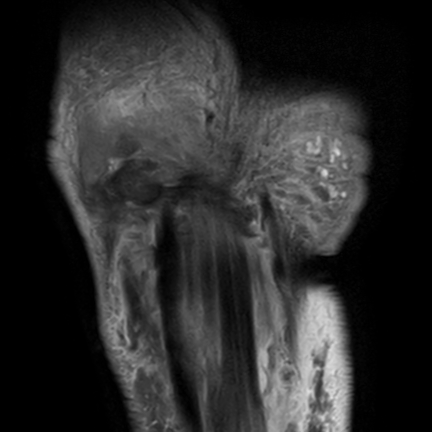

[Series 701: PD fat-sat · sagittal · right · 3.0mm · 0.23mm/px · 3 of 26 slices shown (3 of 3)]
[im 4/26]
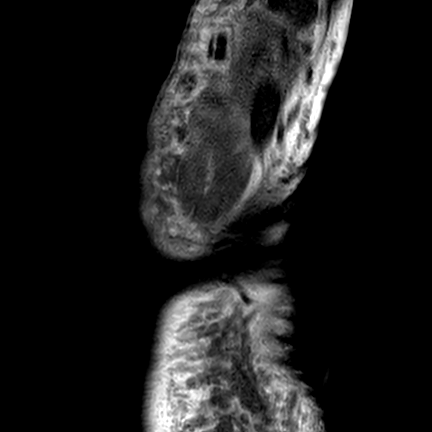
[im 15/26]
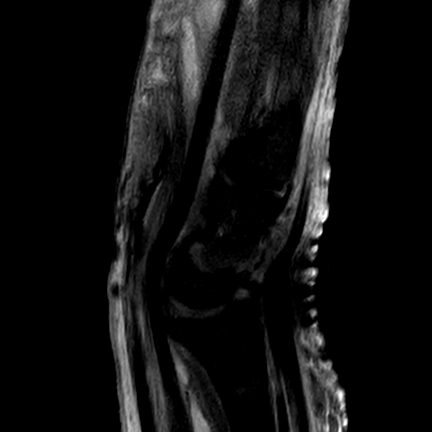
[im 22/26]
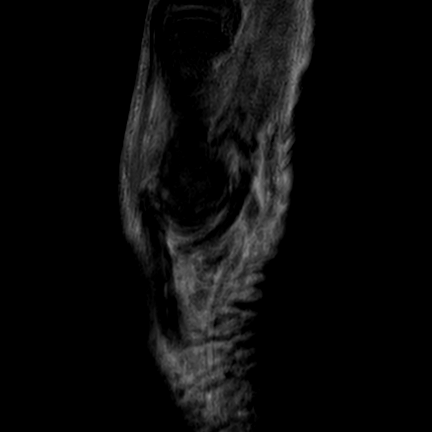

[18 of 40 positions shown; findings below may reference images not displayed]

FINDINGS: BONES AND JOINTS: Advanced articular cartilaginous loss of the first CMC and STT 
situations. There is osseous remodeling of the trapezium with peripheral 
subluxation of the base of the first metacarpal. There is a well-corticated 
osseous debris about the remodeled trapezium. Scattered less prominent articular 
cartilaginous loss of the IP articulations. No fracture. No erosion. No 
prominent joint effusion. 
TENDONS: Scattered fluid distends both the flexor and extensor tendons with 
scattered tenosynovitis. There is some longitudinal split thickness tearing of 
the flexor tendons of the third and fourth digits. There is longitudinal split 
thickness tearing with partial peripheral subluxation of the extensor carpi 
ulnaris at the level of the ulnar styloid. No complete tendon disruption. 
Annular pulleys and volar plates appear preserved. Extensor hoods appear 
preserved. 
SOFT TISSUES: Diffuse prominent subcutaneous edema. There is scattered edema 
throughout the musculature. Included neurovascular bundles appear negative. No 
focal circumscribed fluid collection or ganglion. No soft tissue mass. Included 
ligaments appear preserved. Specifically, the scapholunate and lunotriquetral 
ligaments appear preserved.
IMPRESSION: 1.  Prominent soft tissue edema and tenosynovitis. Findings are nonspecific and 
could be seen with an inflammatory arthritides, correlating with patients 
clinical history. 
2.  Advanced degenerative changes, focally prominent involving the first CMC and 
STT articulations. 
3.  Longitudinal split thickness tearing of flexor and extensor tendons.

## 2022-10-30 IMAGING — MR MRI RIGHT HAND WITHOUT CONTRAST
3 of 6 series · 9 of 40 positions shown · IV contrast (gadolinium)
Comparison: Radiograph of 09/17/2022 and 05/29/2021

________________________________________________________________________________________________ 
MRI RIGHT HAND WITHOUT CONTRAST, MRI RIGHT WRIST WITHOUT CONTRAST, 10/30/2022 
[DATE]: 
CLINICAL INDICATION: Inflammatory Polyarthropathy history of right hand and 
wrist pain with swelling. Undifferentiated inflammatory arthritis.
TECHNIQUE: Multiplanar, multiecho position MR images of the hand and wrist were 
performed without intravenous gadolinium enhancement. Patient was scanned on a 
1.5T magnet. Exams were performed on the same day and are read in conjunction 
with each other.

[Series 201: survey right · coronal · right · 10.0mm · 0.56mm/px · 3 of 15 slices shown]
[im 1/15]
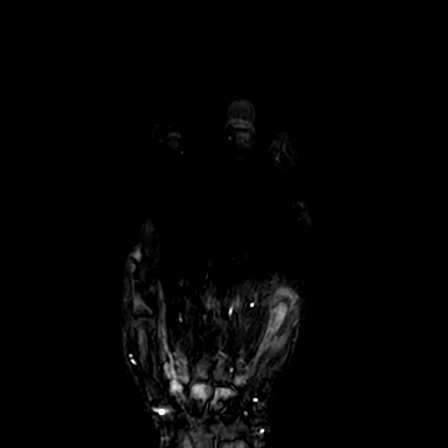
[im 8/15]
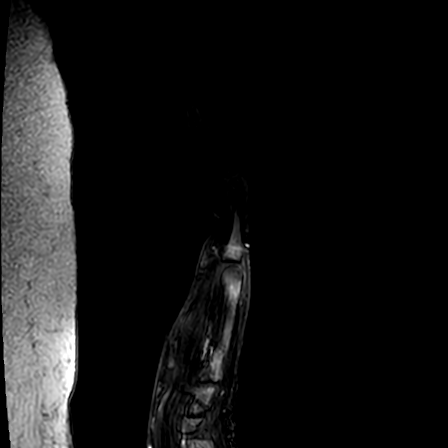
[im 15/15]
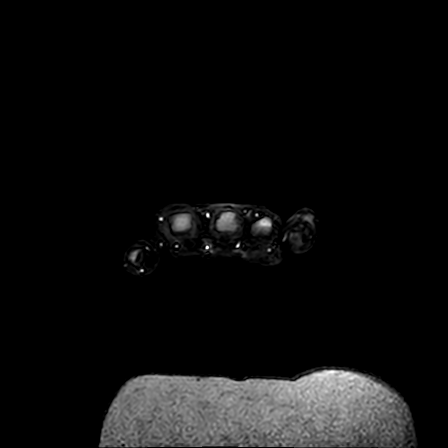

[Series 301: T1 · axial · right · 3.0mm · 0.15mm/px · z∈[-57,+93]mm · 3 of 55 slices shown (1 of 2)]
[im 6/55]
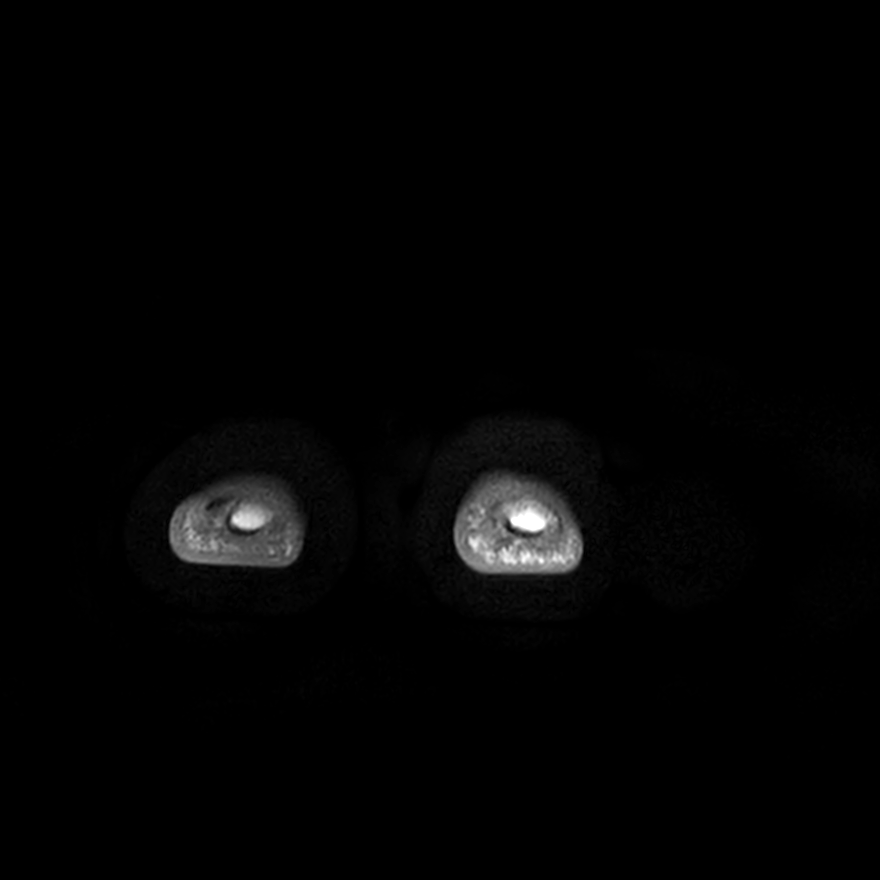
[im 28/55]
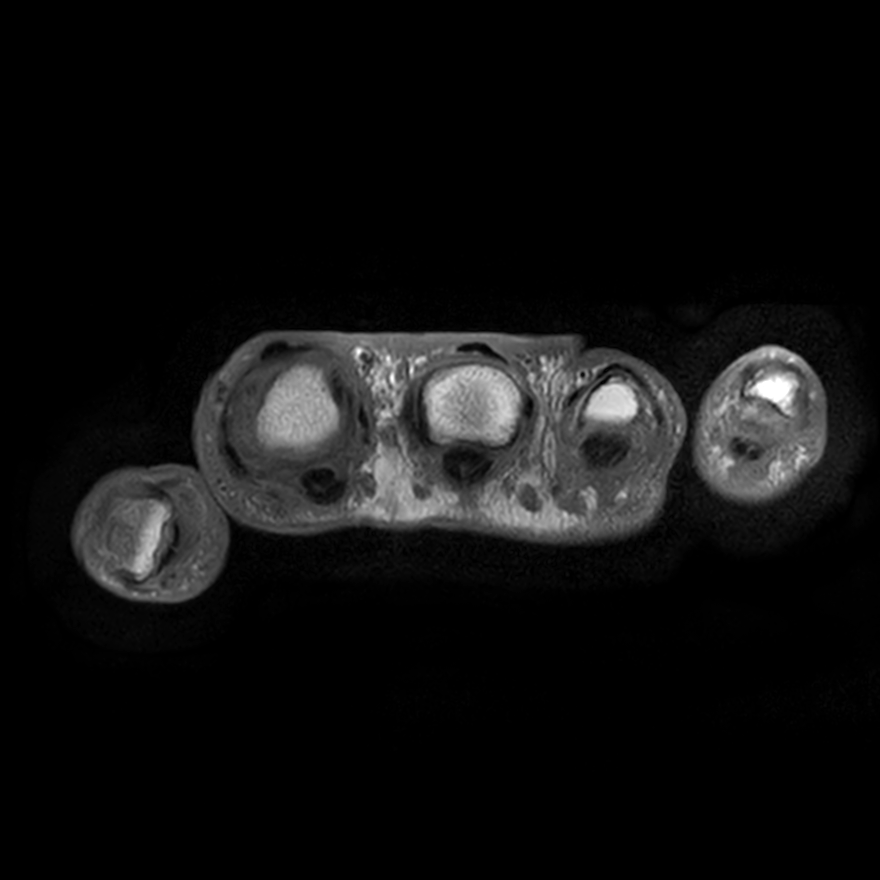
[im 49/55]
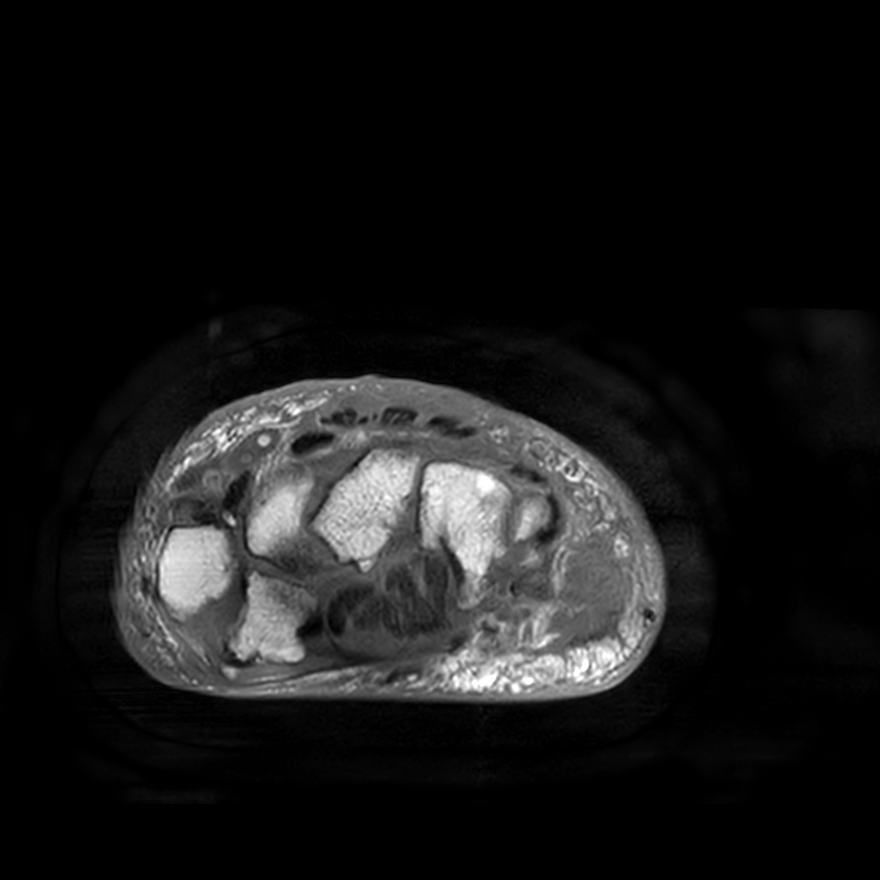

[Series 501: T1 · coronal · right · 2.0mm · 0.21mm/px · 3 of 26 slices shown (2 of 2)]
[im 1/26]
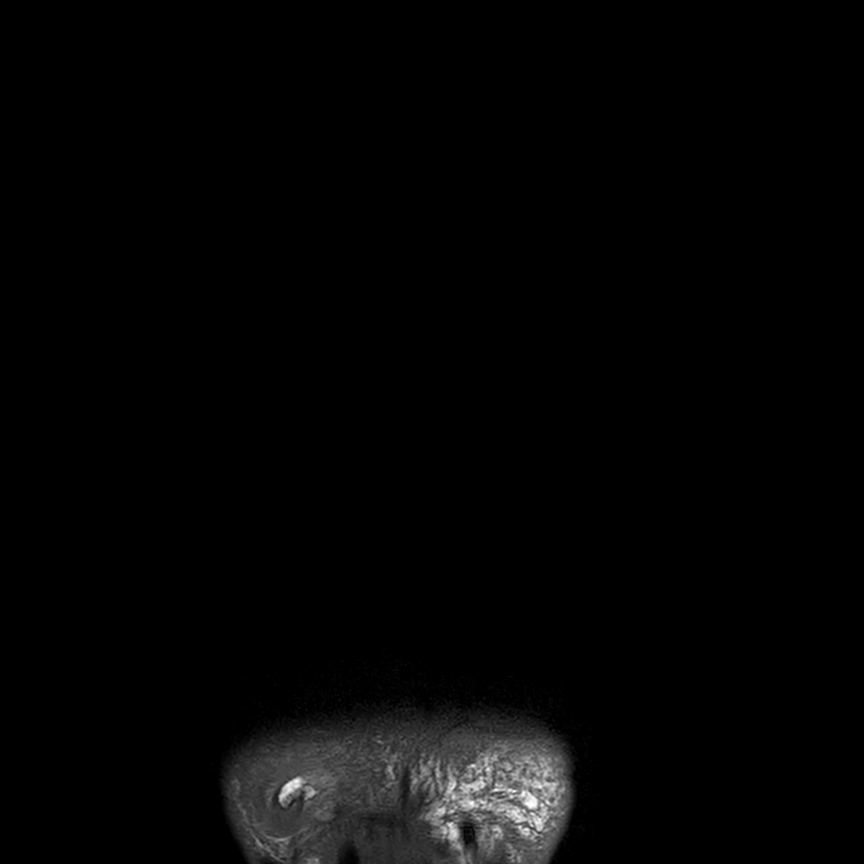
[im 13/26]
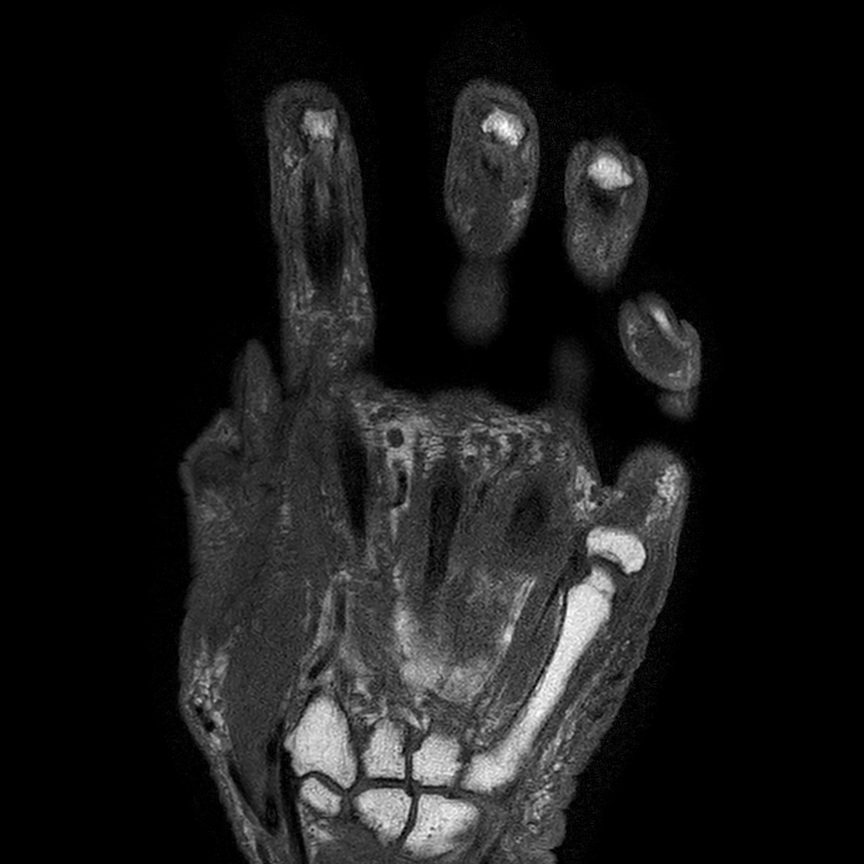
[im 26/26]
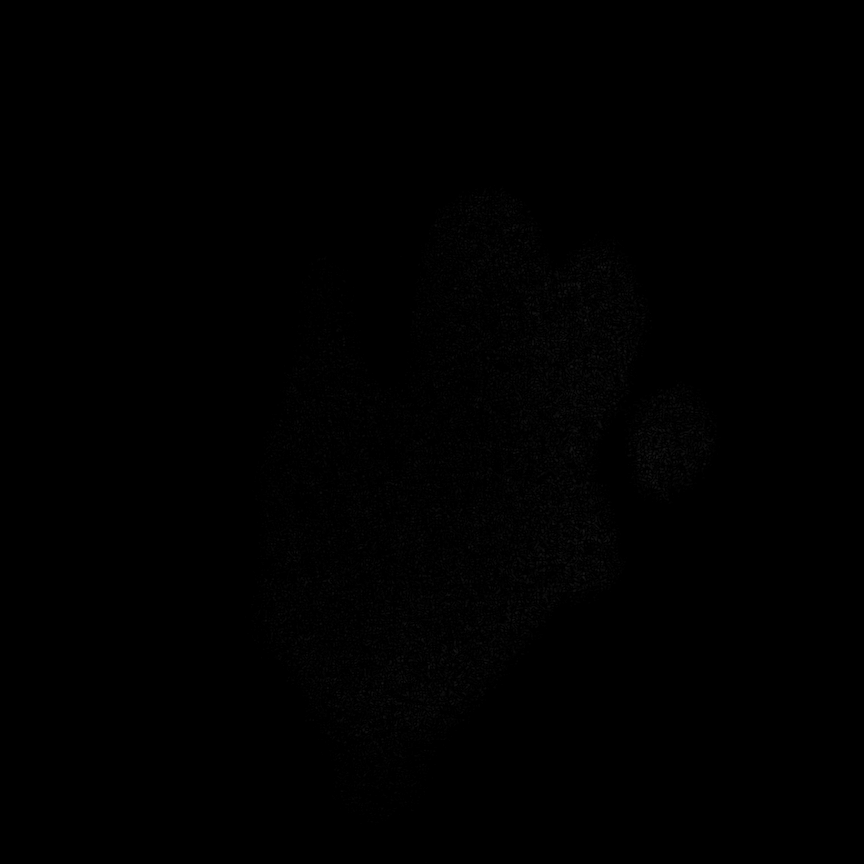

[9 of 40 positions shown; findings below may reference images not displayed]

FINDINGS: BONES AND JOINTS: Advanced articular cartilaginous loss of the first CMC and STT 
situations. There is osseous remodeling of the trapezium with peripheral 
subluxation of the base of the first metacarpal. There is a well-corticated 
osseous debris about the remodeled trapezium. Scattered less prominent articular 
cartilaginous loss of the IP articulations. No fracture. No erosion. No 
prominent joint effusion. 
TENDONS: Scattered fluid distends both the flexor and extensor tendons with 
scattered tenosynovitis. There is some longitudinal split thickness tearing of 
the flexor tendons of the third and fourth digits. There is longitudinal split 
thickness tearing with partial peripheral subluxation of the extensor carpi 
ulnaris at the level of the ulnar styloid. No complete tendon disruption. 
Annular pulleys and volar plates appear preserved. Extensor hoods appear 
preserved. 
SOFT TISSUES: Diffuse prominent subcutaneous edema. There is scattered edema 
throughout the musculature. Included neurovascular bundles appear negative. No 
focal circumscribed fluid collection or ganglion. No soft tissue mass. Included 
ligaments appear preserved. Specifically, the scapholunate and lunotriquetral 
ligaments appear preserved.
IMPRESSION: 1.  Prominent soft tissue edema and tenosynovitis. Findings are nonspecific and 
could be seen with an inflammatory arthritides, correlating with patients 
clinical history. 
2.  Advanced degenerative changes, focally prominent involving the first CMC and 
STT articulations. 
3.  Longitudinal split thickness tearing of flexor and extensor tendons.

## 2022-11-14 IMAGING — DX CHEST PA AND LATERAL
1 series · 2 of 2 positions shown · non-contrast
Comparison: None.

________________________________________________________________________________________________ 
CLINICAL INDICATION: Positive QuantiFERON-TB Gold test.

[Series 1: PA · U · 0.14mm/px · 2 of 2 slices shown]
[im 1/2]
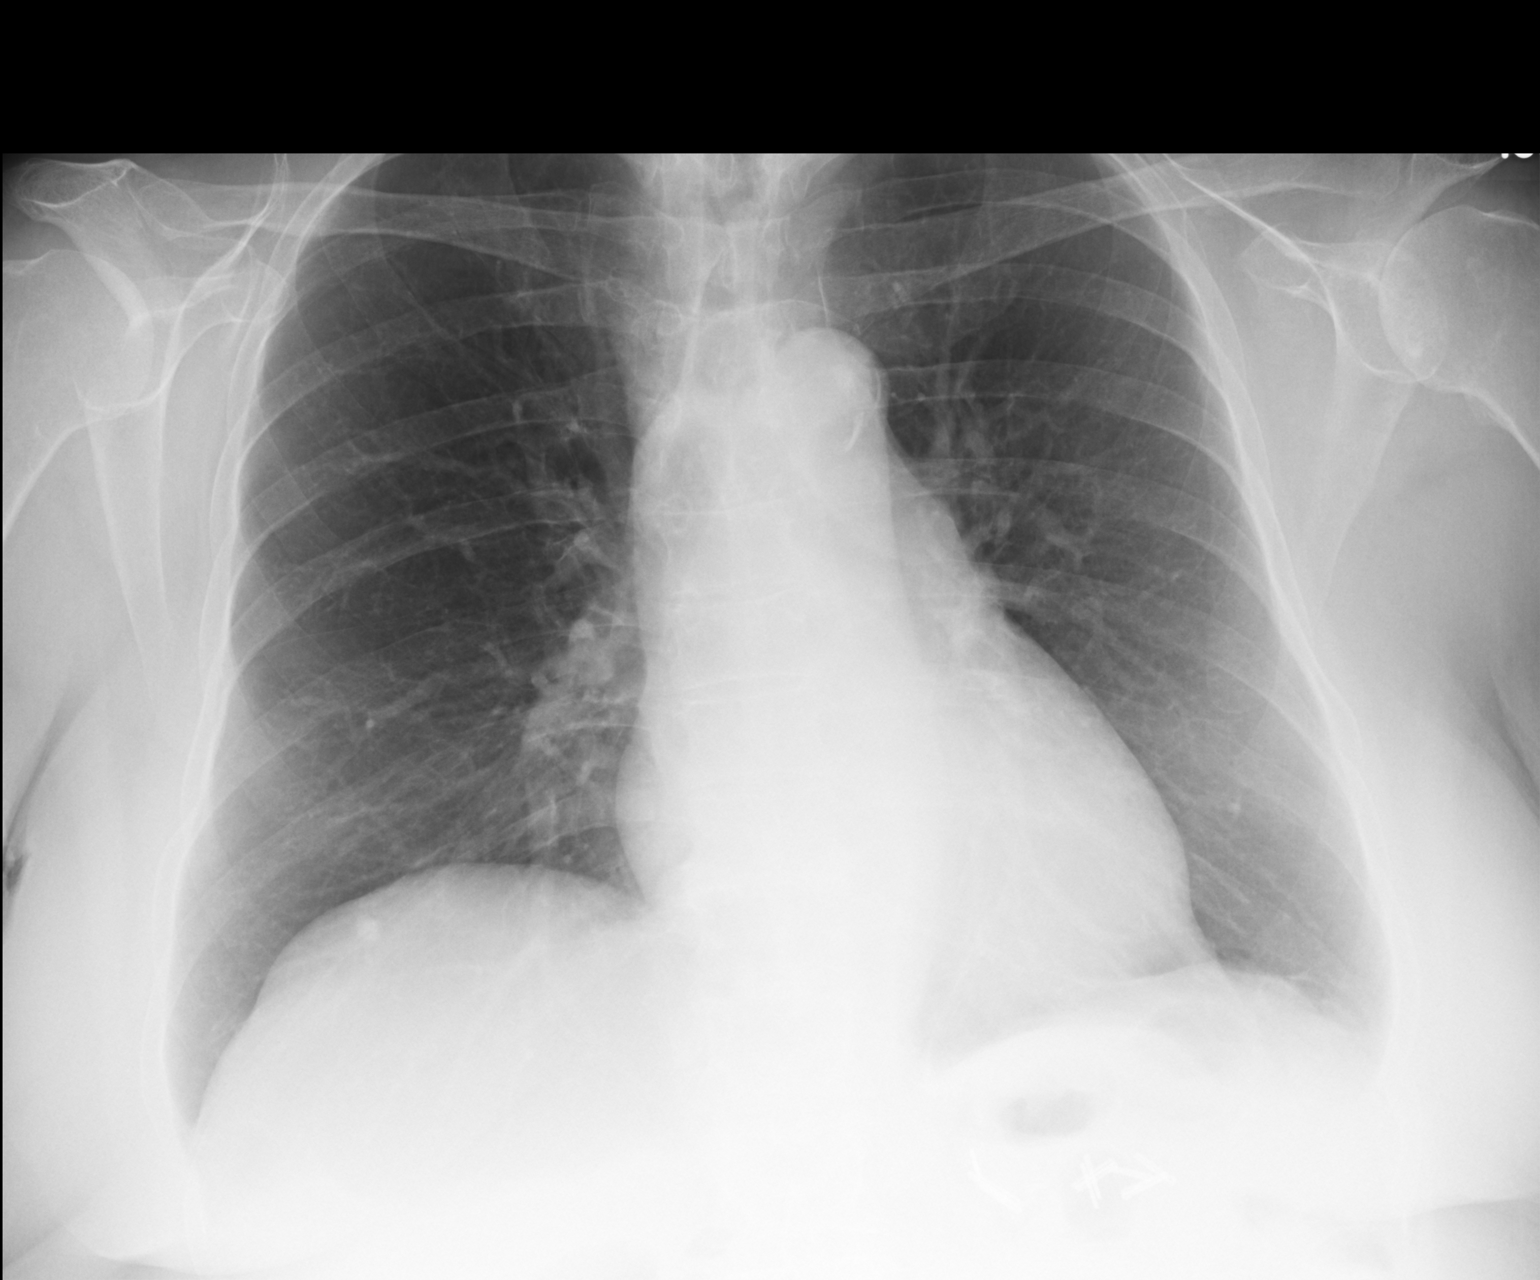
[im 2/2]
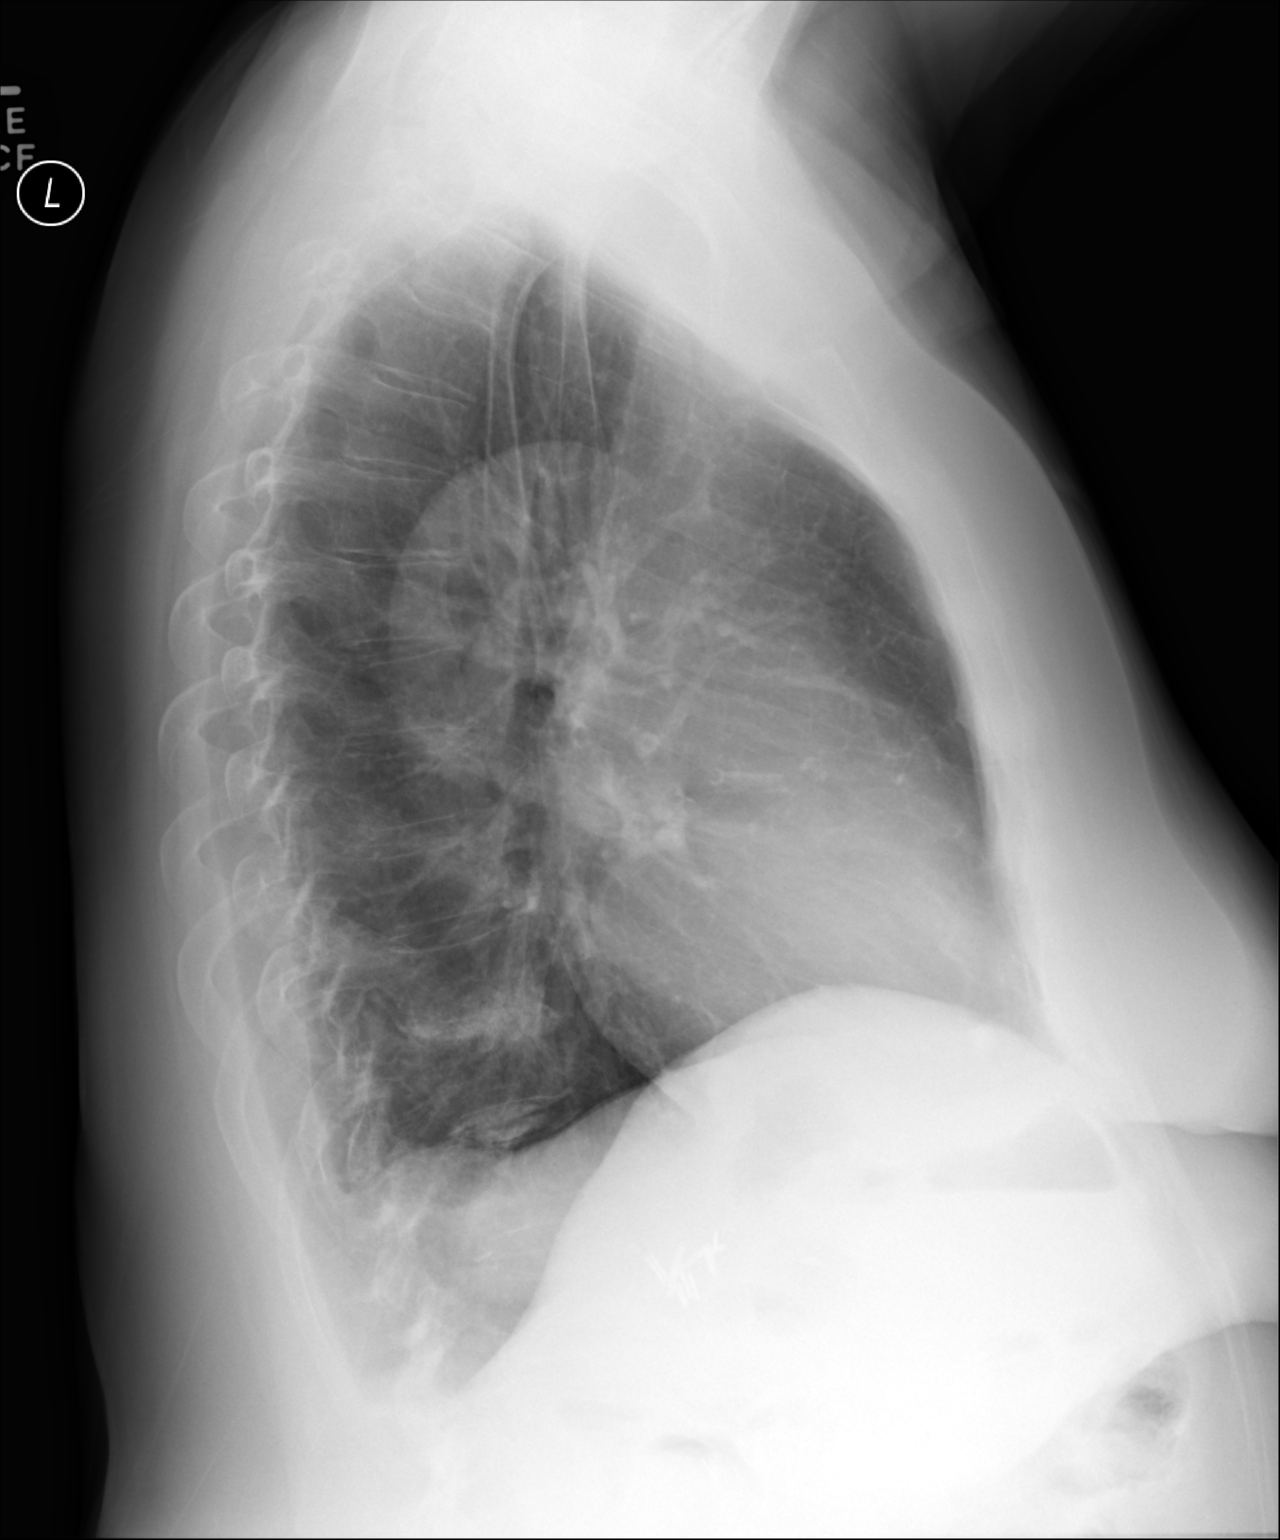

[2 of 2 positions shown; findings below may reference images not displayed]

FINDINGS: Lungs are clear. No consolidation. No effusion. Normal cardiac size 
and pulmonary vascularity. Mild mediastinal prominence, likely due to prominent 
main pulmonary artery. Atherosclerosis. No pneumothorax. No acute osseous 
abnormality. Left abdominal clips.
IMPRESSION: No consolidation or evidence of TB.

## 2023-06-03 NOTE — Telephone Encounter (Signed)
LM for Renee to call back

## 2023-06-03 NOTE — Telephone Encounter (Signed)
 Pt note seen since 2018 but looking to re-established (moved back from [facility])  I see sister as well  Hx of renal failure s/p transplant with some recent rejection issues.  Back in [place] living with sister for time being  Having some cognitive issues  Can add on tomorrow at 1015 or 1115  Pr week I get back in same day slot (pref 345 slot)  Or 945 on 4/10  Thanks!

## 2023-06-04 ENCOUNTER — Ambulatory Visit: Payer: Medicare (Managed Care)

## 2023-06-04 NOTE — Telephone Encounter (Signed)
 Spoke with pt, foot doesn't appear to be infected, no fever or AMS. After icing and some massaging seems to be doing better. Rescheduled NPV and advised if foot doesn't continue to improve that she should be seen at an urgent care.

## 2023-06-04 NOTE — Telephone Encounter (Signed)
 Patient woke up this morning and her left foot is swollen and sore to the touch.  Patient put ice on foot but it hasn't helped.    Patient would like to cancel appt for today @ 10:15am.    Patient would like a call back from the MA.

## 2023-06-04 NOTE — Telephone Encounter (Signed)
 Hi Cindy Ramirez can you call and get more detials, not sure I can be of much help without an exam  If it looks infected at all - she is febrile, AMS, hot red skin should see someone ASAP (like today)

## 2023-06-19 ENCOUNTER — Ambulatory Visit: Payer: Medicare (Managed Care)
